# Patient Record
Sex: Female | Born: 1975 | Race: White | Hispanic: No | Marital: Single | Smoking: Former smoker
Health system: Southern US, Community
[De-identification: ages and names within clinical notes are randomized; demographics above are authoritative.]

## PROBLEM LIST (undated history)

## (undated) DIAGNOSIS — K900 Celiac disease: Secondary | ICD-10-CM

## (undated) DIAGNOSIS — J189 Pneumonia, unspecified organism: Secondary | ICD-10-CM

## (undated) DIAGNOSIS — R102 Pelvic and perineal pain unspecified side: Secondary | ICD-10-CM

## (undated) DIAGNOSIS — J449 Chronic obstructive pulmonary disease, unspecified: Secondary | ICD-10-CM

## (undated) HISTORY — DX: Pelvic and perineal pain: R10.2

## (undated) HISTORY — DX: Pelvic and perineal pain unspecified side: R10.20

## (undated) HISTORY — PX: ABDOMINAL HYSTERECTOMY: SHX81

## (undated) HISTORY — DX: Chronic obstructive pulmonary disease, unspecified: J44.9

## (undated) HISTORY — PX: FRACTURE SURGERY: SHX138

## (undated) HISTORY — PX: ABDOMINAL SURGERY: SHX537

---

## 1997-04-24 ENCOUNTER — Inpatient Hospital Stay (HOSPITAL_COMMUNITY): Admission: AD | Admit: 1997-04-24 | Discharge: 1997-04-24 | Payer: Self-pay | Admitting: Obstetrics

## 1997-04-27 ENCOUNTER — Inpatient Hospital Stay (HOSPITAL_COMMUNITY): Admission: AD | Admit: 1997-04-27 | Discharge: 1997-04-27 | Payer: Self-pay | Admitting: Obstetrics & Gynecology

## 1997-05-02 ENCOUNTER — Inpatient Hospital Stay (HOSPITAL_COMMUNITY): Admission: AD | Admit: 1997-05-02 | Discharge: 1997-05-02 | Payer: Self-pay | Admitting: Obstetrics & Gynecology

## 1997-06-18 ENCOUNTER — Inpatient Hospital Stay (HOSPITAL_COMMUNITY): Admission: AD | Admit: 1997-06-18 | Discharge: 1997-06-18 | Payer: Self-pay | Admitting: Obstetrics & Gynecology

## 1997-08-27 ENCOUNTER — Inpatient Hospital Stay (HOSPITAL_COMMUNITY): Admission: AD | Admit: 1997-08-27 | Discharge: 1997-08-27 | Payer: Self-pay | Admitting: *Deleted

## 1997-08-28 ENCOUNTER — Inpatient Hospital Stay (HOSPITAL_COMMUNITY): Admission: RE | Admit: 1997-08-28 | Discharge: 1997-08-28 | Payer: Self-pay | Admitting: *Deleted

## 1997-08-30 ENCOUNTER — Inpatient Hospital Stay (HOSPITAL_COMMUNITY): Admission: AD | Admit: 1997-08-30 | Discharge: 1997-08-30 | Payer: Self-pay | Admitting: *Deleted

## 1997-08-31 ENCOUNTER — Inpatient Hospital Stay (HOSPITAL_COMMUNITY): Admission: AD | Admit: 1997-08-31 | Discharge: 1997-08-31 | Payer: Self-pay | Admitting: *Deleted

## 1997-09-01 ENCOUNTER — Inpatient Hospital Stay (HOSPITAL_COMMUNITY): Admission: AD | Admit: 1997-09-01 | Discharge: 1997-09-01 | Payer: Self-pay | Admitting: Obstetrics

## 1997-09-14 ENCOUNTER — Inpatient Hospital Stay (HOSPITAL_COMMUNITY): Admission: AD | Admit: 1997-09-14 | Discharge: 1997-09-14 | Payer: Self-pay | Admitting: Obstetrics

## 1997-09-22 ENCOUNTER — Inpatient Hospital Stay (HOSPITAL_COMMUNITY): Admission: AD | Admit: 1997-09-22 | Discharge: 1997-09-22 | Payer: Self-pay | Admitting: Obstetrics

## 1997-09-24 ENCOUNTER — Inpatient Hospital Stay (HOSPITAL_COMMUNITY): Admission: AD | Admit: 1997-09-24 | Discharge: 1997-09-24 | Payer: Self-pay | Admitting: *Deleted

## 1997-10-18 ENCOUNTER — Inpatient Hospital Stay (HOSPITAL_COMMUNITY): Admission: AD | Admit: 1997-10-18 | Discharge: 1997-10-18 | Payer: Self-pay | Admitting: Obstetrics

## 1997-10-21 ENCOUNTER — Inpatient Hospital Stay (HOSPITAL_COMMUNITY): Admission: AD | Admit: 1997-10-21 | Discharge: 1997-10-21 | Payer: Self-pay | Admitting: Obstetrics & Gynecology

## 1997-10-30 ENCOUNTER — Inpatient Hospital Stay (HOSPITAL_COMMUNITY): Admission: AD | Admit: 1997-10-30 | Discharge: 1997-10-30 | Payer: Self-pay | Admitting: Obstetrics

## 1997-11-13 ENCOUNTER — Inpatient Hospital Stay (HOSPITAL_COMMUNITY): Admission: AD | Admit: 1997-11-13 | Discharge: 1997-11-13 | Payer: Self-pay | Admitting: Obstetrics

## 1997-11-16 ENCOUNTER — Inpatient Hospital Stay (HOSPITAL_COMMUNITY): Admission: AD | Admit: 1997-11-16 | Discharge: 1997-11-18 | Payer: Self-pay | Admitting: Obstetrics

## 1997-11-23 ENCOUNTER — Inpatient Hospital Stay (HOSPITAL_COMMUNITY): Admission: AD | Admit: 1997-11-23 | Discharge: 1997-11-23 | Payer: Self-pay | Admitting: Obstetrics

## 1997-11-29 ENCOUNTER — Inpatient Hospital Stay (HOSPITAL_COMMUNITY): Admission: AD | Admit: 1997-11-29 | Discharge: 1997-11-29 | Payer: Self-pay | Admitting: Obstetrics

## 1997-12-02 ENCOUNTER — Inpatient Hospital Stay (HOSPITAL_COMMUNITY): Admission: AD | Admit: 1997-12-02 | Discharge: 1997-12-02 | Payer: Self-pay | Admitting: Obstetrics

## 1997-12-10 ENCOUNTER — Inpatient Hospital Stay (HOSPITAL_COMMUNITY): Admission: AD | Admit: 1997-12-10 | Discharge: 1997-12-13 | Payer: Self-pay | Admitting: Obstetrics

## 1997-12-13 ENCOUNTER — Encounter (HOSPITAL_COMMUNITY): Admission: RE | Admit: 1997-12-13 | Discharge: 1998-02-07 | Payer: Self-pay | Admitting: Obstetrics

## 1998-03-28 ENCOUNTER — Inpatient Hospital Stay (HOSPITAL_COMMUNITY): Admission: AD | Admit: 1998-03-28 | Discharge: 1998-03-28 | Payer: Self-pay | Admitting: Obstetrics

## 1998-06-18 ENCOUNTER — Inpatient Hospital Stay (HOSPITAL_COMMUNITY): Admission: AD | Admit: 1998-06-18 | Discharge: 1998-06-18 | Payer: Self-pay | Admitting: Obstetrics

## 1998-07-19 ENCOUNTER — Emergency Department (HOSPITAL_COMMUNITY): Admission: EM | Admit: 1998-07-19 | Discharge: 1998-07-19 | Payer: Self-pay | Admitting: Emergency Medicine

## 1998-08-25 ENCOUNTER — Emergency Department (HOSPITAL_COMMUNITY): Admission: EM | Admit: 1998-08-25 | Discharge: 1998-08-26 | Payer: Self-pay | Admitting: Emergency Medicine

## 1998-08-25 ENCOUNTER — Encounter: Payer: Self-pay | Admitting: Emergency Medicine

## 1998-08-26 ENCOUNTER — Emergency Department (HOSPITAL_COMMUNITY): Admission: EM | Admit: 1998-08-26 | Discharge: 1998-08-27 | Payer: Self-pay

## 1998-09-30 ENCOUNTER — Inpatient Hospital Stay (HOSPITAL_COMMUNITY): Admission: AD | Admit: 1998-09-30 | Discharge: 1998-09-30 | Payer: Self-pay | Admitting: *Deleted

## 1998-11-16 ENCOUNTER — Emergency Department (HOSPITAL_COMMUNITY): Admission: EM | Admit: 1998-11-16 | Discharge: 1998-11-16 | Payer: Self-pay

## 1998-11-30 ENCOUNTER — Encounter: Payer: Self-pay | Admitting: Obstetrics

## 1998-11-30 ENCOUNTER — Inpatient Hospital Stay (HOSPITAL_COMMUNITY): Admission: AD | Admit: 1998-11-30 | Discharge: 1998-12-02 | Payer: Self-pay | Admitting: Obstetrics

## 1998-12-01 ENCOUNTER — Encounter: Payer: Self-pay | Admitting: Obstetrics

## 1999-01-14 ENCOUNTER — Inpatient Hospital Stay (HOSPITAL_COMMUNITY): Admission: AD | Admit: 1999-01-14 | Discharge: 1999-01-14 | Payer: Self-pay | Admitting: Obstetrics

## 1999-02-14 ENCOUNTER — Emergency Department (HOSPITAL_COMMUNITY): Admission: EM | Admit: 1999-02-14 | Discharge: 1999-02-14 | Payer: Self-pay | Admitting: *Deleted

## 1999-03-09 ENCOUNTER — Encounter: Payer: Self-pay | Admitting: Obstetrics

## 1999-03-09 ENCOUNTER — Inpatient Hospital Stay (HOSPITAL_COMMUNITY): Admission: AD | Admit: 1999-03-09 | Discharge: 1999-03-10 | Payer: Self-pay | Admitting: Obstetrics & Gynecology

## 1999-06-13 ENCOUNTER — Emergency Department (HOSPITAL_COMMUNITY): Admission: EM | Admit: 1999-06-13 | Discharge: 1999-06-13 | Payer: Self-pay | Admitting: *Deleted

## 1999-06-19 ENCOUNTER — Encounter: Payer: Self-pay | Admitting: Obstetrics

## 1999-06-19 ENCOUNTER — Inpatient Hospital Stay (HOSPITAL_COMMUNITY): Admission: AD | Admit: 1999-06-19 | Discharge: 1999-06-19 | Payer: Self-pay | Admitting: Obstetrics

## 1999-07-20 ENCOUNTER — Inpatient Hospital Stay (HOSPITAL_COMMUNITY): Admission: AD | Admit: 1999-07-20 | Discharge: 1999-07-20 | Payer: Self-pay | Admitting: Obstetrics

## 1999-08-11 ENCOUNTER — Encounter: Payer: Self-pay | Admitting: Emergency Medicine

## 1999-08-11 ENCOUNTER — Emergency Department (HOSPITAL_COMMUNITY): Admission: EM | Admit: 1999-08-11 | Discharge: 1999-08-12 | Payer: Self-pay | Admitting: Emergency Medicine

## 1999-08-18 ENCOUNTER — Encounter: Payer: Self-pay | Admitting: Emergency Medicine

## 1999-08-18 ENCOUNTER — Emergency Department (HOSPITAL_COMMUNITY): Admission: EM | Admit: 1999-08-18 | Discharge: 1999-08-18 | Payer: Self-pay | Admitting: Emergency Medicine

## 1999-09-25 ENCOUNTER — Inpatient Hospital Stay (HOSPITAL_COMMUNITY): Admission: AD | Admit: 1999-09-25 | Discharge: 1999-09-25 | Payer: Self-pay | Admitting: Obstetrics

## 1999-09-30 ENCOUNTER — Inpatient Hospital Stay (HOSPITAL_COMMUNITY): Admission: AD | Admit: 1999-09-30 | Discharge: 1999-09-30 | Payer: Self-pay | Admitting: Obstetrics

## 1999-10-01 ENCOUNTER — Ambulatory Visit (HOSPITAL_COMMUNITY): Admission: EM | Admit: 1999-10-01 | Discharge: 1999-10-01 | Payer: Self-pay | Admitting: Obstetrics & Gynecology

## 1999-10-01 ENCOUNTER — Encounter: Payer: Self-pay | Admitting: Obstetrics & Gynecology

## 1999-11-06 ENCOUNTER — Inpatient Hospital Stay (HOSPITAL_COMMUNITY): Admission: AD | Admit: 1999-11-06 | Discharge: 1999-11-06 | Payer: Self-pay | Admitting: Obstetrics & Gynecology

## 1999-12-09 ENCOUNTER — Emergency Department (HOSPITAL_COMMUNITY): Admission: EM | Admit: 1999-12-09 | Discharge: 1999-12-09 | Payer: Self-pay | Admitting: Emergency Medicine

## 1999-12-09 ENCOUNTER — Encounter: Payer: Self-pay | Admitting: Emergency Medicine

## 1999-12-10 ENCOUNTER — Inpatient Hospital Stay (HOSPITAL_COMMUNITY): Admission: AD | Admit: 1999-12-10 | Discharge: 1999-12-10 | Payer: Self-pay | Admitting: Obstetrics

## 1999-12-10 ENCOUNTER — Encounter: Payer: Self-pay | Admitting: Emergency Medicine

## 1999-12-31 ENCOUNTER — Inpatient Hospital Stay (HOSPITAL_COMMUNITY): Admission: AD | Admit: 1999-12-31 | Discharge: 1999-12-31 | Payer: Self-pay | Admitting: Obstetrics & Gynecology

## 2000-01-28 ENCOUNTER — Other Ambulatory Visit: Admission: RE | Admit: 2000-01-28 | Discharge: 2000-01-28 | Payer: Self-pay | Admitting: Obstetrics

## 2000-01-28 ENCOUNTER — Encounter: Admission: RE | Admit: 2000-01-28 | Discharge: 2000-01-28 | Payer: Self-pay | Admitting: Obstetrics

## 2000-02-01 ENCOUNTER — Encounter: Admission: RE | Admit: 2000-02-01 | Discharge: 2000-02-01 | Payer: Self-pay | Admitting: Internal Medicine

## 2000-02-02 ENCOUNTER — Encounter: Payer: Self-pay | Admitting: Emergency Medicine

## 2000-02-02 ENCOUNTER — Emergency Department (HOSPITAL_COMMUNITY): Admission: EM | Admit: 2000-02-02 | Discharge: 2000-02-02 | Payer: Self-pay | Admitting: Emergency Medicine

## 2000-02-03 ENCOUNTER — Inpatient Hospital Stay (HOSPITAL_COMMUNITY): Admission: EM | Admit: 2000-02-03 | Discharge: 2000-02-04 | Payer: Self-pay | Admitting: Emergency Medicine

## 2000-02-03 ENCOUNTER — Encounter: Payer: Self-pay | Admitting: Emergency Medicine

## 2000-02-08 ENCOUNTER — Encounter: Admission: RE | Admit: 2000-02-08 | Discharge: 2000-02-08 | Payer: Self-pay | Admitting: Internal Medicine

## 2000-02-25 ENCOUNTER — Emergency Department (HOSPITAL_COMMUNITY): Admission: EM | Admit: 2000-02-25 | Discharge: 2000-02-25 | Payer: Self-pay | Admitting: Emergency Medicine

## 2000-02-25 ENCOUNTER — Encounter: Payer: Self-pay | Admitting: Emergency Medicine

## 2000-03-11 ENCOUNTER — Encounter: Admission: RE | Admit: 2000-03-11 | Discharge: 2000-03-11 | Payer: Self-pay | Admitting: Internal Medicine

## 2000-03-15 ENCOUNTER — Encounter: Payer: Self-pay | Admitting: Internal Medicine

## 2000-03-15 ENCOUNTER — Inpatient Hospital Stay (HOSPITAL_COMMUNITY): Admission: EM | Admit: 2000-03-15 | Discharge: 2000-03-16 | Payer: Self-pay | Admitting: Emergency Medicine

## 2000-03-16 ENCOUNTER — Encounter: Payer: Self-pay | Admitting: Internal Medicine

## 2000-03-22 ENCOUNTER — Inpatient Hospital Stay (HOSPITAL_COMMUNITY): Admission: AD | Admit: 2000-03-22 | Discharge: 2000-03-22 | Payer: Self-pay | Admitting: *Deleted

## 2000-03-23 ENCOUNTER — Encounter: Admission: RE | Admit: 2000-03-23 | Discharge: 2000-03-23 | Payer: Self-pay | Admitting: Internal Medicine

## 2000-04-12 ENCOUNTER — Emergency Department (HOSPITAL_COMMUNITY): Admission: EM | Admit: 2000-04-12 | Discharge: 2000-04-12 | Payer: Self-pay

## 2000-04-13 ENCOUNTER — Emergency Department (HOSPITAL_COMMUNITY): Admission: EM | Admit: 2000-04-13 | Discharge: 2000-04-13 | Payer: Self-pay | Admitting: Emergency Medicine

## 2000-04-14 ENCOUNTER — Encounter: Admission: RE | Admit: 2000-04-14 | Discharge: 2000-04-14 | Payer: Self-pay

## 2000-04-15 ENCOUNTER — Encounter: Payer: Self-pay | Admitting: Emergency Medicine

## 2000-04-16 ENCOUNTER — Encounter: Payer: Self-pay | Admitting: Internal Medicine

## 2000-04-16 ENCOUNTER — Inpatient Hospital Stay (HOSPITAL_COMMUNITY): Admission: EM | Admit: 2000-04-16 | Discharge: 2000-04-18 | Payer: Self-pay | Admitting: Emergency Medicine

## 2000-04-28 ENCOUNTER — Encounter: Admission: RE | Admit: 2000-04-28 | Discharge: 2000-04-28 | Payer: Self-pay | Admitting: Internal Medicine

## 2000-05-17 ENCOUNTER — Encounter: Admission: RE | Admit: 2000-05-17 | Discharge: 2000-05-17 | Payer: Self-pay | Admitting: Hematology and Oncology

## 2000-05-24 ENCOUNTER — Encounter: Admission: RE | Admit: 2000-05-24 | Discharge: 2000-05-24 | Payer: Self-pay | Admitting: Hematology and Oncology

## 2000-05-26 ENCOUNTER — Encounter: Admission: RE | Admit: 2000-05-26 | Discharge: 2000-05-26 | Payer: Self-pay | Admitting: Internal Medicine

## 2000-06-02 ENCOUNTER — Encounter: Admission: RE | Admit: 2000-06-02 | Discharge: 2000-06-02 | Payer: Self-pay | Admitting: Obstetrics

## 2000-07-06 ENCOUNTER — Ambulatory Visit (HOSPITAL_COMMUNITY): Admission: RE | Admit: 2000-07-06 | Discharge: 2000-07-06 | Payer: Self-pay | Admitting: Internal Medicine

## 2000-07-06 ENCOUNTER — Encounter: Admission: RE | Admit: 2000-07-06 | Discharge: 2000-07-06 | Payer: Self-pay | Admitting: Internal Medicine

## 2000-07-06 ENCOUNTER — Encounter: Payer: Self-pay | Admitting: Internal Medicine

## 2000-07-08 ENCOUNTER — Ambulatory Visit (HOSPITAL_COMMUNITY): Admission: RE | Admit: 2000-07-08 | Discharge: 2000-07-08 | Payer: Self-pay | Admitting: *Deleted

## 2000-07-13 ENCOUNTER — Ambulatory Visit (HOSPITAL_COMMUNITY): Admission: RE | Admit: 2000-07-13 | Discharge: 2000-07-13 | Payer: Self-pay | Admitting: Internal Medicine

## 2000-07-20 ENCOUNTER — Encounter: Admission: RE | Admit: 2000-07-20 | Discharge: 2000-07-20 | Payer: Self-pay | Admitting: Internal Medicine

## 2000-08-29 ENCOUNTER — Emergency Department (HOSPITAL_COMMUNITY): Admission: EM | Admit: 2000-08-29 | Discharge: 2000-08-29 | Payer: Self-pay | Admitting: Emergency Medicine

## 2000-08-30 ENCOUNTER — Emergency Department (HOSPITAL_COMMUNITY): Admission: EM | Admit: 2000-08-30 | Discharge: 2000-08-30 | Payer: Self-pay | Admitting: Emergency Medicine

## 2000-08-30 ENCOUNTER — Encounter: Payer: Self-pay | Admitting: Emergency Medicine

## 2000-08-31 ENCOUNTER — Encounter: Admission: RE | Admit: 2000-08-31 | Discharge: 2000-08-31 | Payer: Self-pay | Admitting: Internal Medicine

## 2000-09-08 ENCOUNTER — Encounter: Admission: RE | Admit: 2000-09-08 | Discharge: 2000-09-08 | Payer: Self-pay | Admitting: Internal Medicine

## 2000-09-09 ENCOUNTER — Emergency Department (HOSPITAL_COMMUNITY): Admission: EM | Admit: 2000-09-09 | Discharge: 2000-09-10 | Payer: Self-pay | Admitting: Emergency Medicine

## 2000-09-16 ENCOUNTER — Ambulatory Visit (HOSPITAL_COMMUNITY): Admission: RE | Admit: 2000-09-16 | Discharge: 2000-09-16 | Payer: Self-pay | Admitting: Gastroenterology

## 2000-09-16 ENCOUNTER — Encounter: Payer: Self-pay | Admitting: Gastroenterology

## 2000-09-20 ENCOUNTER — Encounter: Admission: RE | Admit: 2000-09-20 | Discharge: 2000-09-20 | Payer: Self-pay | Admitting: Internal Medicine

## 2000-09-26 ENCOUNTER — Ambulatory Visit (HOSPITAL_COMMUNITY): Admission: RE | Admit: 2000-09-26 | Discharge: 2000-09-26 | Payer: Self-pay | Admitting: Obstetrics

## 2000-10-04 ENCOUNTER — Encounter: Admission: RE | Admit: 2000-10-04 | Discharge: 2000-10-04 | Payer: Self-pay | Admitting: Obstetrics & Gynecology

## 2000-10-06 ENCOUNTER — Emergency Department (HOSPITAL_COMMUNITY): Admission: EM | Admit: 2000-10-06 | Discharge: 2000-10-07 | Payer: Self-pay | Admitting: Emergency Medicine

## 2000-10-06 ENCOUNTER — Encounter: Payer: Self-pay | Admitting: Emergency Medicine

## 2000-10-07 ENCOUNTER — Encounter: Payer: Self-pay | Admitting: Emergency Medicine

## 2000-11-15 ENCOUNTER — Encounter: Admission: RE | Admit: 2000-11-15 | Discharge: 2000-11-15 | Payer: Self-pay | Admitting: Internal Medicine

## 2000-11-17 ENCOUNTER — Emergency Department (HOSPITAL_COMMUNITY): Admission: EM | Admit: 2000-11-17 | Discharge: 2000-11-17 | Payer: Self-pay | Admitting: Emergency Medicine

## 2000-11-22 ENCOUNTER — Ambulatory Visit (HOSPITAL_COMMUNITY): Admission: RE | Admit: 2000-11-22 | Discharge: 2000-11-22 | Payer: Self-pay | Admitting: Internal Medicine

## 2000-11-22 ENCOUNTER — Encounter: Admission: RE | Admit: 2000-11-22 | Discharge: 2000-11-22 | Payer: Self-pay | Admitting: Internal Medicine

## 2000-12-08 ENCOUNTER — Inpatient Hospital Stay (HOSPITAL_COMMUNITY): Admission: AD | Admit: 2000-12-08 | Discharge: 2000-12-08 | Payer: Self-pay | Admitting: Obstetrics & Gynecology

## 2000-12-19 ENCOUNTER — Encounter: Payer: Self-pay | Admitting: Gastroenterology

## 2000-12-19 ENCOUNTER — Ambulatory Visit (HOSPITAL_COMMUNITY): Admission: RE | Admit: 2000-12-19 | Discharge: 2000-12-19 | Payer: Self-pay | Admitting: Gastroenterology

## 2001-01-11 ENCOUNTER — Encounter: Payer: Self-pay | Admitting: Emergency Medicine

## 2001-01-11 ENCOUNTER — Emergency Department (HOSPITAL_COMMUNITY): Admission: EM | Admit: 2001-01-11 | Discharge: 2001-01-12 | Payer: Self-pay | Admitting: Emergency Medicine

## 2001-01-12 ENCOUNTER — Encounter: Payer: Self-pay | Admitting: Emergency Medicine

## 2001-02-15 ENCOUNTER — Other Ambulatory Visit: Admission: RE | Admit: 2001-02-15 | Discharge: 2001-02-15 | Payer: Self-pay | Admitting: Obstetrics and Gynecology

## 2001-02-26 ENCOUNTER — Encounter: Payer: Self-pay | Admitting: Emergency Medicine

## 2001-02-26 ENCOUNTER — Emergency Department (HOSPITAL_COMMUNITY): Admission: EM | Admit: 2001-02-26 | Discharge: 2001-02-26 | Payer: Self-pay | Admitting: Emergency Medicine

## 2001-05-15 ENCOUNTER — Encounter: Payer: Self-pay | Admitting: Emergency Medicine

## 2001-05-15 ENCOUNTER — Emergency Department (HOSPITAL_COMMUNITY): Admission: EM | Admit: 2001-05-15 | Discharge: 2001-05-15 | Payer: Self-pay | Admitting: Emergency Medicine

## 2001-07-17 ENCOUNTER — Encounter: Payer: Self-pay | Admitting: Emergency Medicine

## 2001-07-17 ENCOUNTER — Emergency Department (HOSPITAL_COMMUNITY): Admission: EM | Admit: 2001-07-17 | Discharge: 2001-07-17 | Payer: Self-pay | Admitting: Emergency Medicine

## 2001-07-20 ENCOUNTER — Emergency Department (HOSPITAL_COMMUNITY): Admission: EM | Admit: 2001-07-20 | Discharge: 2001-07-20 | Payer: Self-pay | Admitting: Emergency Medicine

## 2001-07-21 ENCOUNTER — Emergency Department (HOSPITAL_COMMUNITY): Admission: EM | Admit: 2001-07-21 | Discharge: 2001-07-22 | Payer: Self-pay | Admitting: *Deleted

## 2001-07-21 ENCOUNTER — Encounter: Payer: Self-pay | Admitting: Emergency Medicine

## 2001-09-18 ENCOUNTER — Inpatient Hospital Stay (HOSPITAL_COMMUNITY): Admission: AD | Admit: 2001-09-18 | Discharge: 2001-09-18 | Payer: Self-pay | Admitting: *Deleted

## 2001-09-19 ENCOUNTER — Inpatient Hospital Stay (HOSPITAL_COMMUNITY): Admission: AD | Admit: 2001-09-19 | Discharge: 2001-09-23 | Payer: Self-pay | Admitting: *Deleted

## 2001-09-20 ENCOUNTER — Encounter: Payer: Self-pay | Admitting: *Deleted

## 2001-10-09 ENCOUNTER — Inpatient Hospital Stay (HOSPITAL_COMMUNITY): Admission: AD | Admit: 2001-10-09 | Discharge: 2001-10-09 | Payer: Self-pay | Admitting: Family Medicine

## 2001-12-20 ENCOUNTER — Inpatient Hospital Stay (HOSPITAL_COMMUNITY): Admission: AD | Admit: 2001-12-20 | Discharge: 2001-12-20 | Payer: Self-pay | Admitting: Obstetrics and Gynecology

## 2002-01-10 ENCOUNTER — Other Ambulatory Visit: Admission: RE | Admit: 2002-01-10 | Discharge: 2002-01-10 | Payer: Self-pay | Admitting: Internal Medicine

## 2002-01-10 ENCOUNTER — Encounter: Admission: RE | Admit: 2002-01-10 | Discharge: 2002-01-10 | Payer: Self-pay | Admitting: Internal Medicine

## 2002-06-16 ENCOUNTER — Encounter: Payer: Self-pay | Admitting: *Deleted

## 2002-06-16 ENCOUNTER — Inpatient Hospital Stay (HOSPITAL_COMMUNITY): Admission: AD | Admit: 2002-06-16 | Discharge: 2002-06-16 | Payer: Self-pay | Admitting: *Deleted

## 2002-08-03 ENCOUNTER — Encounter: Payer: Self-pay | Admitting: Emergency Medicine

## 2002-08-03 ENCOUNTER — Emergency Department (HOSPITAL_COMMUNITY): Admission: AC | Admit: 2002-08-03 | Discharge: 2002-08-03 | Payer: Self-pay

## 2002-12-26 ENCOUNTER — Inpatient Hospital Stay (HOSPITAL_COMMUNITY): Admission: AD | Admit: 2002-12-26 | Discharge: 2002-12-26 | Payer: Self-pay | Admitting: Obstetrics and Gynecology

## 2003-01-16 ENCOUNTER — Emergency Department (HOSPITAL_COMMUNITY): Admission: EM | Admit: 2003-01-16 | Discharge: 2003-01-17 | Payer: Self-pay | Admitting: *Deleted

## 2003-05-28 ENCOUNTER — Emergency Department (HOSPITAL_COMMUNITY): Admission: EM | Admit: 2003-05-28 | Discharge: 2003-05-29 | Payer: Self-pay

## 2003-11-16 ENCOUNTER — Emergency Department (HOSPITAL_COMMUNITY): Admission: EM | Admit: 2003-11-16 | Discharge: 2003-11-16 | Payer: Self-pay | Admitting: Emergency Medicine

## 2003-12-29 ENCOUNTER — Emergency Department (HOSPITAL_COMMUNITY): Admission: EM | Admit: 2003-12-29 | Discharge: 2003-12-30 | Payer: Self-pay | Admitting: Emergency Medicine

## 2003-12-31 ENCOUNTER — Emergency Department (HOSPITAL_COMMUNITY): Admission: EM | Admit: 2003-12-31 | Discharge: 2003-12-31 | Payer: Self-pay | Admitting: Emergency Medicine

## 2004-01-02 ENCOUNTER — Emergency Department (HOSPITAL_COMMUNITY): Admission: EM | Admit: 2004-01-02 | Discharge: 2004-01-02 | Payer: Self-pay | Admitting: Emergency Medicine

## 2004-02-12 ENCOUNTER — Ambulatory Visit: Payer: Self-pay | Admitting: Internal Medicine

## 2004-02-17 ENCOUNTER — Encounter: Admission: RE | Admit: 2004-02-17 | Discharge: 2004-03-24 | Payer: Self-pay | Admitting: Internal Medicine

## 2004-02-17 ENCOUNTER — Ambulatory Visit: Payer: Self-pay | Admitting: Internal Medicine

## 2004-03-25 ENCOUNTER — Ambulatory Visit: Payer: Self-pay | Admitting: Internal Medicine

## 2004-04-10 ENCOUNTER — Ambulatory Visit: Payer: Self-pay | Admitting: Internal Medicine

## 2004-04-14 ENCOUNTER — Ambulatory Visit (HOSPITAL_COMMUNITY): Admission: RE | Admit: 2004-04-14 | Discharge: 2004-04-14 | Payer: Self-pay | Admitting: Internal Medicine

## 2004-05-07 ENCOUNTER — Emergency Department (HOSPITAL_COMMUNITY): Admission: EM | Admit: 2004-05-07 | Discharge: 2004-05-07 | Payer: Self-pay | Admitting: Emergency Medicine

## 2004-06-24 ENCOUNTER — Emergency Department (HOSPITAL_COMMUNITY): Admission: EM | Admit: 2004-06-24 | Discharge: 2004-06-24 | Payer: Self-pay | Admitting: Emergency Medicine

## 2004-08-18 ENCOUNTER — Ambulatory Visit: Payer: Self-pay | Admitting: Internal Medicine

## 2004-10-12 ENCOUNTER — Ambulatory Visit: Payer: Self-pay | Admitting: Internal Medicine

## 2004-11-26 ENCOUNTER — Encounter (INDEPENDENT_AMBULATORY_CARE_PROVIDER_SITE_OTHER): Payer: Self-pay | Admitting: Specialist

## 2004-11-26 ENCOUNTER — Ambulatory Visit (HOSPITAL_COMMUNITY): Admission: RE | Admit: 2004-11-26 | Discharge: 2004-11-26 | Payer: Self-pay | Admitting: Gastroenterology

## 2005-01-24 ENCOUNTER — Emergency Department (HOSPITAL_COMMUNITY): Admission: EM | Admit: 2005-01-24 | Discharge: 2005-01-24 | Payer: Self-pay | Admitting: Emergency Medicine

## 2005-01-30 ENCOUNTER — Emergency Department (HOSPITAL_COMMUNITY): Admission: EM | Admit: 2005-01-30 | Discharge: 2005-01-30 | Payer: Self-pay | Admitting: Emergency Medicine

## 2005-02-10 ENCOUNTER — Emergency Department (HOSPITAL_COMMUNITY): Admission: EM | Admit: 2005-02-10 | Discharge: 2005-02-10 | Payer: Self-pay | Admitting: Emergency Medicine

## 2005-02-11 ENCOUNTER — Emergency Department (HOSPITAL_COMMUNITY): Admission: EM | Admit: 2005-02-11 | Discharge: 2005-02-11 | Payer: Self-pay | Admitting: Emergency Medicine

## 2005-03-15 ENCOUNTER — Emergency Department (HOSPITAL_COMMUNITY): Admission: EM | Admit: 2005-03-15 | Discharge: 2005-03-15 | Payer: Self-pay | Admitting: Emergency Medicine

## 2005-04-09 ENCOUNTER — Encounter (INDEPENDENT_AMBULATORY_CARE_PROVIDER_SITE_OTHER): Payer: Self-pay | Admitting: *Deleted

## 2005-04-09 ENCOUNTER — Ambulatory Visit: Payer: Self-pay | Admitting: Internal Medicine

## 2005-04-09 LAB — CONVERTED CEMR LAB
ALT: 18 units/L
AST: 21 units/L
Alkaline Phosphatase: 61 units/L
Basophils Relative: 0 %
Creatinine, Ser: 0.7 mg/dL
Eosinophils Relative: 1 %
HCT: 41 %
Lymphs Abs: 2.6 10*3/uL
MCV: 92.1 fL
Monocytes Absolute: 0.6 10*3/uL
Pap Smear: NORMAL
Platelets: 236 10*3/uL
RDW: 12.9 %
Sodium: 142 meq/L
Total Bilirubin: 0.6 mg/dL

## 2005-04-16 ENCOUNTER — Ambulatory Visit (HOSPITAL_COMMUNITY): Admission: RE | Admit: 2005-04-16 | Discharge: 2005-04-16 | Payer: Self-pay | Admitting: Internal Medicine

## 2005-04-27 ENCOUNTER — Encounter: Admission: RE | Admit: 2005-04-27 | Discharge: 2005-05-17 | Payer: Self-pay | Admitting: Nephrology

## 2005-05-17 ENCOUNTER — Emergency Department (HOSPITAL_COMMUNITY): Admission: EM | Admit: 2005-05-17 | Discharge: 2005-05-17 | Payer: Self-pay | Admitting: Family Medicine

## 2005-05-18 ENCOUNTER — Emergency Department (HOSPITAL_COMMUNITY): Admission: EM | Admit: 2005-05-18 | Discharge: 2005-05-18 | Payer: Self-pay | Admitting: Emergency Medicine

## 2005-06-03 ENCOUNTER — Emergency Department (HOSPITAL_COMMUNITY): Admission: EM | Admit: 2005-06-03 | Discharge: 2005-06-03 | Payer: Self-pay | Admitting: Emergency Medicine

## 2005-06-11 ENCOUNTER — Ambulatory Visit: Payer: Self-pay | Admitting: Internal Medicine

## 2005-06-14 ENCOUNTER — Emergency Department (HOSPITAL_COMMUNITY): Admission: EM | Admit: 2005-06-14 | Discharge: 2005-06-14 | Payer: Self-pay | Admitting: Family Medicine

## 2005-06-21 ENCOUNTER — Emergency Department (HOSPITAL_COMMUNITY): Admission: EM | Admit: 2005-06-21 | Discharge: 2005-06-21 | Payer: Self-pay | Admitting: Family Medicine

## 2005-07-06 ENCOUNTER — Encounter (INDEPENDENT_AMBULATORY_CARE_PROVIDER_SITE_OTHER): Payer: Self-pay | Admitting: Specialist

## 2005-07-06 ENCOUNTER — Ambulatory Visit (HOSPITAL_COMMUNITY): Admission: RE | Admit: 2005-07-06 | Discharge: 2005-07-06 | Payer: Self-pay | Admitting: Gastroenterology

## 2005-07-19 ENCOUNTER — Emergency Department (HOSPITAL_COMMUNITY): Admission: EM | Admit: 2005-07-19 | Discharge: 2005-07-19 | Payer: Self-pay | Admitting: Family Medicine

## 2005-07-21 ENCOUNTER — Encounter: Admission: RE | Admit: 2005-07-21 | Discharge: 2005-07-21 | Payer: Self-pay | Admitting: Gastroenterology

## 2005-08-13 ENCOUNTER — Inpatient Hospital Stay (HOSPITAL_COMMUNITY): Admission: AD | Admit: 2005-08-13 | Discharge: 2005-08-13 | Payer: Self-pay | Admitting: Obstetrics & Gynecology

## 2005-08-30 ENCOUNTER — Inpatient Hospital Stay (HOSPITAL_COMMUNITY): Admission: AD | Admit: 2005-08-30 | Discharge: 2005-08-31 | Payer: Self-pay | Admitting: Obstetrics and Gynecology

## 2005-11-10 ENCOUNTER — Emergency Department (HOSPITAL_COMMUNITY): Admission: EM | Admit: 2005-11-10 | Discharge: 2005-11-10 | Payer: Self-pay | Admitting: Family Medicine

## 2005-12-06 ENCOUNTER — Ambulatory Visit (HOSPITAL_COMMUNITY): Admission: RE | Admit: 2005-12-06 | Discharge: 2005-12-06 | Payer: Self-pay | Admitting: Gastroenterology

## 2005-12-31 ENCOUNTER — Encounter (INDEPENDENT_AMBULATORY_CARE_PROVIDER_SITE_OTHER): Payer: Self-pay | Admitting: *Deleted

## 2005-12-31 DIAGNOSIS — T7411XA Adult physical abuse, confirmed, initial encounter: Secondary | ICD-10-CM | POA: Insufficient documentation

## 2005-12-31 DIAGNOSIS — F341 Dysthymic disorder: Secondary | ICD-10-CM | POA: Insufficient documentation

## 2005-12-31 DIAGNOSIS — IMO0001 Reserved for inherently not codable concepts without codable children: Secondary | ICD-10-CM | POA: Insufficient documentation

## 2005-12-31 DIAGNOSIS — J45909 Unspecified asthma, uncomplicated: Secondary | ICD-10-CM | POA: Insufficient documentation

## 2005-12-31 DIAGNOSIS — K900 Celiac disease: Secondary | ICD-10-CM | POA: Insufficient documentation

## 2005-12-31 DIAGNOSIS — R109 Unspecified abdominal pain: Secondary | ICD-10-CM | POA: Insufficient documentation

## 2006-01-05 ENCOUNTER — Emergency Department (HOSPITAL_COMMUNITY): Admission: EM | Admit: 2006-01-05 | Discharge: 2006-01-05 | Payer: Self-pay | Admitting: Family Medicine

## 2006-02-07 ENCOUNTER — Ambulatory Visit (HOSPITAL_BASED_OUTPATIENT_CLINIC_OR_DEPARTMENT_OTHER): Admission: RE | Admit: 2006-02-07 | Discharge: 2006-02-07 | Payer: Self-pay | Admitting: Otolaryngology

## 2006-03-07 DIAGNOSIS — K589 Irritable bowel syndrome without diarrhea: Secondary | ICD-10-CM | POA: Insufficient documentation

## 2006-03-24 ENCOUNTER — Inpatient Hospital Stay (HOSPITAL_COMMUNITY): Admission: EM | Admit: 2006-03-24 | Discharge: 2006-03-26 | Payer: Self-pay | Admitting: Emergency Medicine

## 2006-04-20 ENCOUNTER — Emergency Department (HOSPITAL_COMMUNITY): Admission: EM | Admit: 2006-04-20 | Discharge: 2006-04-20 | Payer: Self-pay | Admitting: Emergency Medicine

## 2006-07-08 ENCOUNTER — Emergency Department (HOSPITAL_COMMUNITY): Admission: EM | Admit: 2006-07-08 | Discharge: 2006-07-08 | Payer: Self-pay | Admitting: Emergency Medicine

## 2006-08-08 ENCOUNTER — Encounter: Admission: RE | Admit: 2006-08-08 | Discharge: 2006-08-30 | Payer: Self-pay | Admitting: Sports Medicine

## 2006-08-09 ENCOUNTER — Emergency Department (HOSPITAL_COMMUNITY): Admission: EM | Admit: 2006-08-09 | Discharge: 2006-08-09 | Payer: Self-pay | Admitting: Emergency Medicine

## 2006-08-11 ENCOUNTER — Emergency Department (HOSPITAL_COMMUNITY): Admission: EM | Admit: 2006-08-11 | Discharge: 2006-08-11 | Payer: Self-pay | Admitting: Emergency Medicine

## 2006-08-14 ENCOUNTER — Emergency Department (HOSPITAL_COMMUNITY): Admission: EM | Admit: 2006-08-14 | Discharge: 2006-08-14 | Payer: Self-pay | Admitting: Family Medicine

## 2006-08-29 ENCOUNTER — Emergency Department (HOSPITAL_COMMUNITY): Admission: EM | Admit: 2006-08-29 | Discharge: 2006-08-30 | Payer: Self-pay | Admitting: Emergency Medicine

## 2006-09-29 ENCOUNTER — Emergency Department (HOSPITAL_COMMUNITY): Admission: EM | Admit: 2006-09-29 | Discharge: 2006-09-29 | Payer: Self-pay | Admitting: Family Medicine

## 2006-11-02 ENCOUNTER — Emergency Department (HOSPITAL_COMMUNITY): Admission: EM | Admit: 2006-11-02 | Discharge: 2006-11-02 | Payer: Self-pay | Admitting: Emergency Medicine

## 2006-11-08 ENCOUNTER — Emergency Department (HOSPITAL_COMMUNITY): Admission: EM | Admit: 2006-11-08 | Discharge: 2006-11-08 | Payer: Self-pay | Admitting: Emergency Medicine

## 2006-12-16 ENCOUNTER — Inpatient Hospital Stay (HOSPITAL_COMMUNITY): Admission: AD | Admit: 2006-12-16 | Discharge: 2006-12-16 | Payer: Self-pay | Admitting: Obstetrics & Gynecology

## 2007-01-17 ENCOUNTER — Emergency Department (HOSPITAL_COMMUNITY): Admission: EM | Admit: 2007-01-17 | Discharge: 2007-01-17 | Payer: Self-pay | Admitting: Family Medicine

## 2007-01-18 ENCOUNTER — Emergency Department (HOSPITAL_COMMUNITY): Admission: EM | Admit: 2007-01-18 | Discharge: 2007-01-18 | Payer: Self-pay | Admitting: Emergency Medicine

## 2007-02-26 ENCOUNTER — Emergency Department (HOSPITAL_COMMUNITY): Admission: EM | Admit: 2007-02-26 | Discharge: 2007-02-26 | Payer: Self-pay | Admitting: Emergency Medicine

## 2007-06-09 ENCOUNTER — Ambulatory Visit (HOSPITAL_COMMUNITY): Admission: RE | Admit: 2007-06-09 | Discharge: 2007-06-09 | Payer: Self-pay | Admitting: Urology

## 2007-06-09 ENCOUNTER — Encounter (INDEPENDENT_AMBULATORY_CARE_PROVIDER_SITE_OTHER): Payer: Self-pay | Admitting: Urology

## 2007-06-20 ENCOUNTER — Emergency Department (HOSPITAL_COMMUNITY): Admission: EM | Admit: 2007-06-20 | Discharge: 2007-06-20 | Payer: Self-pay | Admitting: Emergency Medicine

## 2007-10-04 ENCOUNTER — Emergency Department (HOSPITAL_COMMUNITY): Admission: EM | Admit: 2007-10-04 | Discharge: 2007-10-04 | Payer: Self-pay | Admitting: Emergency Medicine

## 2007-10-05 ENCOUNTER — Emergency Department (HOSPITAL_COMMUNITY): Admission: EM | Admit: 2007-10-05 | Discharge: 2007-10-05 | Payer: Self-pay | Admitting: Emergency Medicine

## 2007-10-08 ENCOUNTER — Emergency Department (HOSPITAL_COMMUNITY): Admission: EM | Admit: 2007-10-08 | Discharge: 2007-10-08 | Payer: Self-pay | Admitting: Emergency Medicine

## 2007-10-31 ENCOUNTER — Encounter (INDEPENDENT_AMBULATORY_CARE_PROVIDER_SITE_OTHER): Payer: Self-pay | Admitting: Obstetrics and Gynecology

## 2007-10-31 ENCOUNTER — Ambulatory Visit (HOSPITAL_COMMUNITY): Admission: RE | Admit: 2007-10-31 | Discharge: 2007-11-01 | Payer: Self-pay | Admitting: Obstetrics and Gynecology

## 2007-11-02 ENCOUNTER — Encounter: Admission: RE | Admit: 2007-11-02 | Discharge: 2007-11-02 | Payer: Self-pay | Admitting: Obstetrics and Gynecology

## 2007-11-04 ENCOUNTER — Observation Stay (HOSPITAL_COMMUNITY): Admission: AD | Admit: 2007-11-04 | Discharge: 2007-11-05 | Payer: Self-pay | Admitting: Obstetrics and Gynecology

## 2007-12-05 ENCOUNTER — Observation Stay (HOSPITAL_COMMUNITY): Admission: AD | Admit: 2007-12-05 | Discharge: 2007-12-06 | Payer: Self-pay | Admitting: Obstetrics and Gynecology

## 2007-12-25 ENCOUNTER — Emergency Department (HOSPITAL_COMMUNITY): Admission: EM | Admit: 2007-12-25 | Discharge: 2007-12-26 | Payer: Self-pay | Admitting: Emergency Medicine

## 2008-03-13 ENCOUNTER — Emergency Department (HOSPITAL_COMMUNITY): Admission: EM | Admit: 2008-03-13 | Discharge: 2008-03-13 | Payer: Self-pay | Admitting: Emergency Medicine

## 2008-08-04 ENCOUNTER — Emergency Department (HOSPITAL_COMMUNITY): Admission: EM | Admit: 2008-08-04 | Discharge: 2008-08-04 | Payer: Self-pay | Admitting: Emergency Medicine

## 2008-09-07 IMAGING — CR DG CHEST 2V
2 series · 2 of 2 positions shown · non-contrast
Comparison: none

HISTORY: Chest and left arm pain, dyspnea, cough, congestion

CHEST 2 VIEWS:
Comparison 08/09/2006
Normal heart size, mediastinal contours, and vascularity.
Lungs clear.
No effusion or pneumothorax.
Bones unremarkable.

[w chest pa]
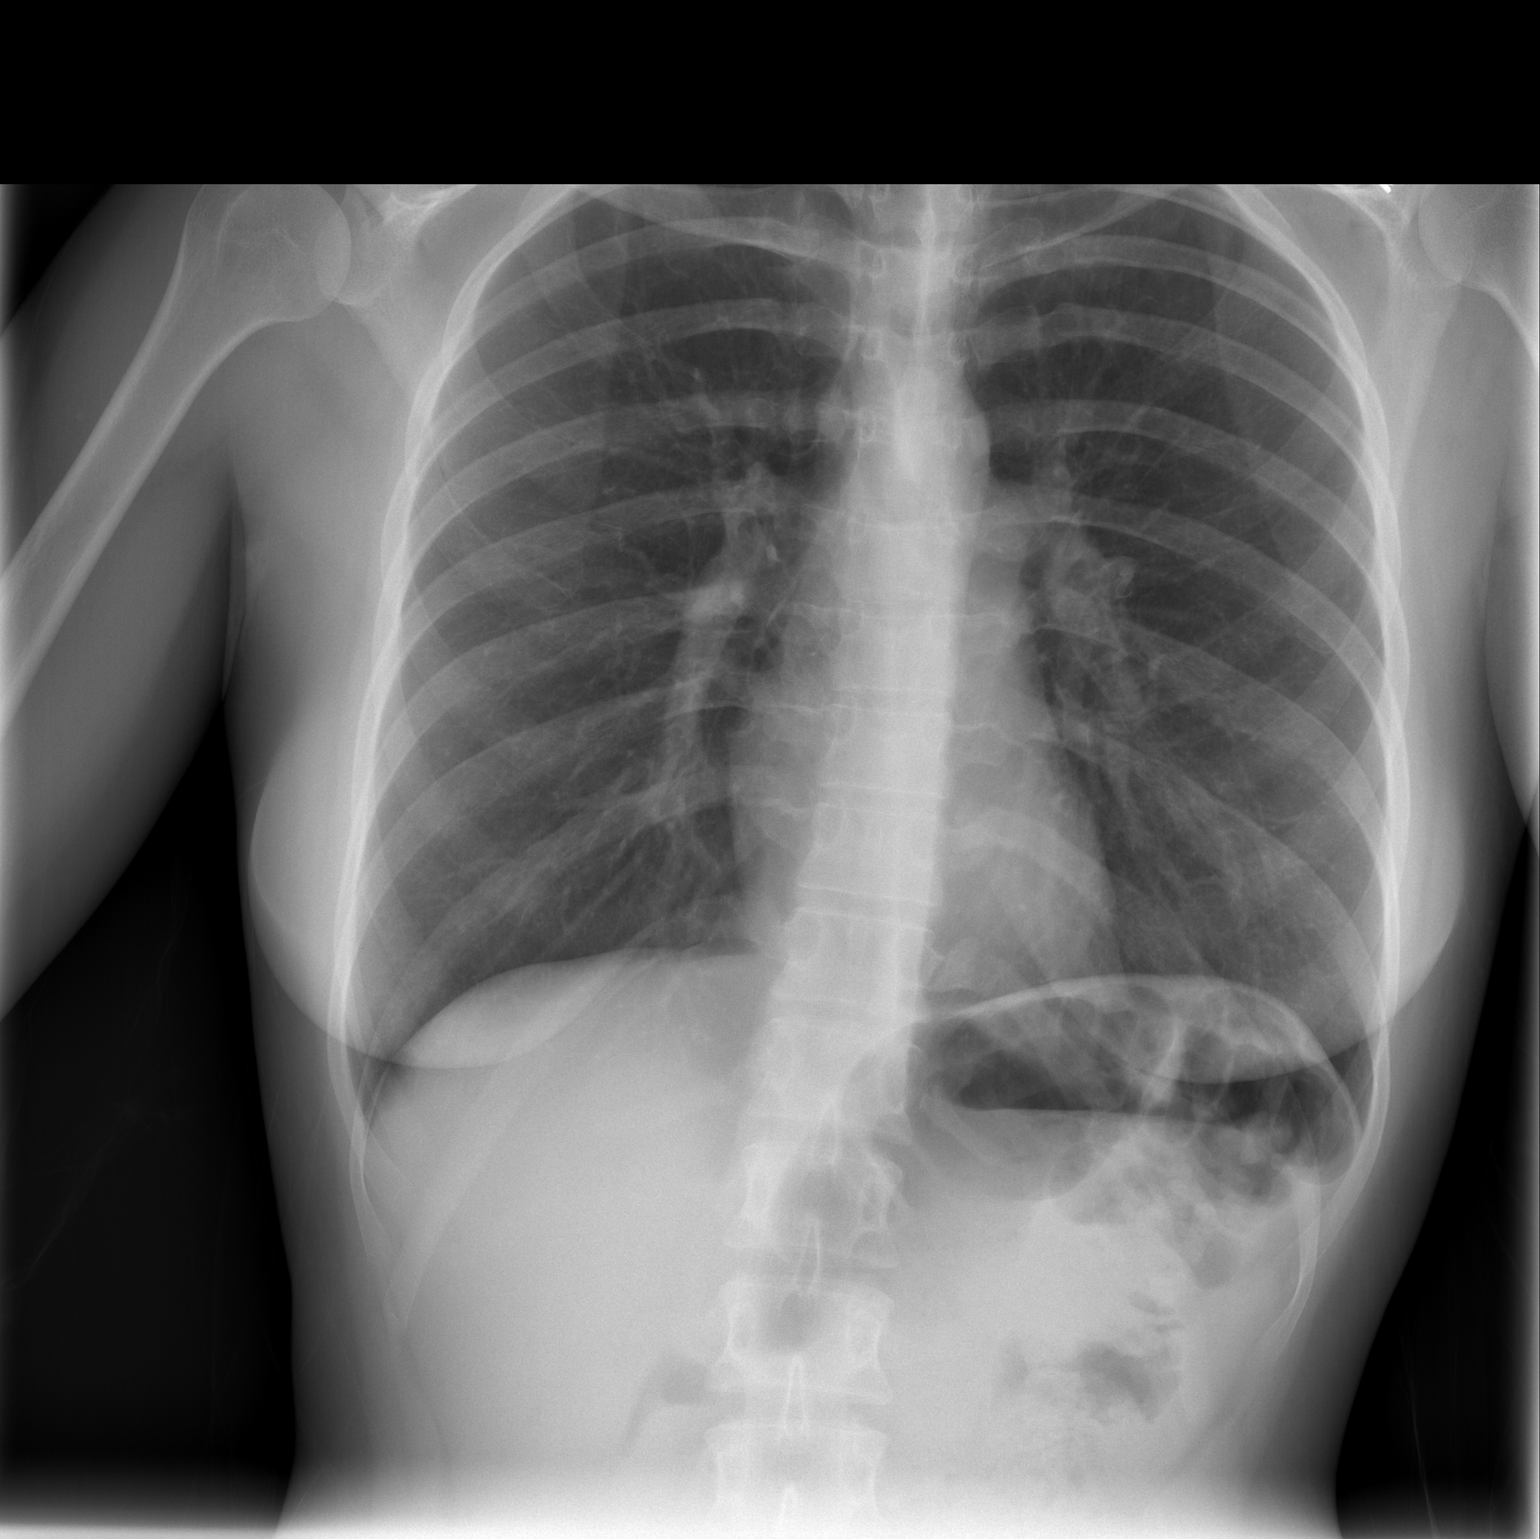

[w chest lat]
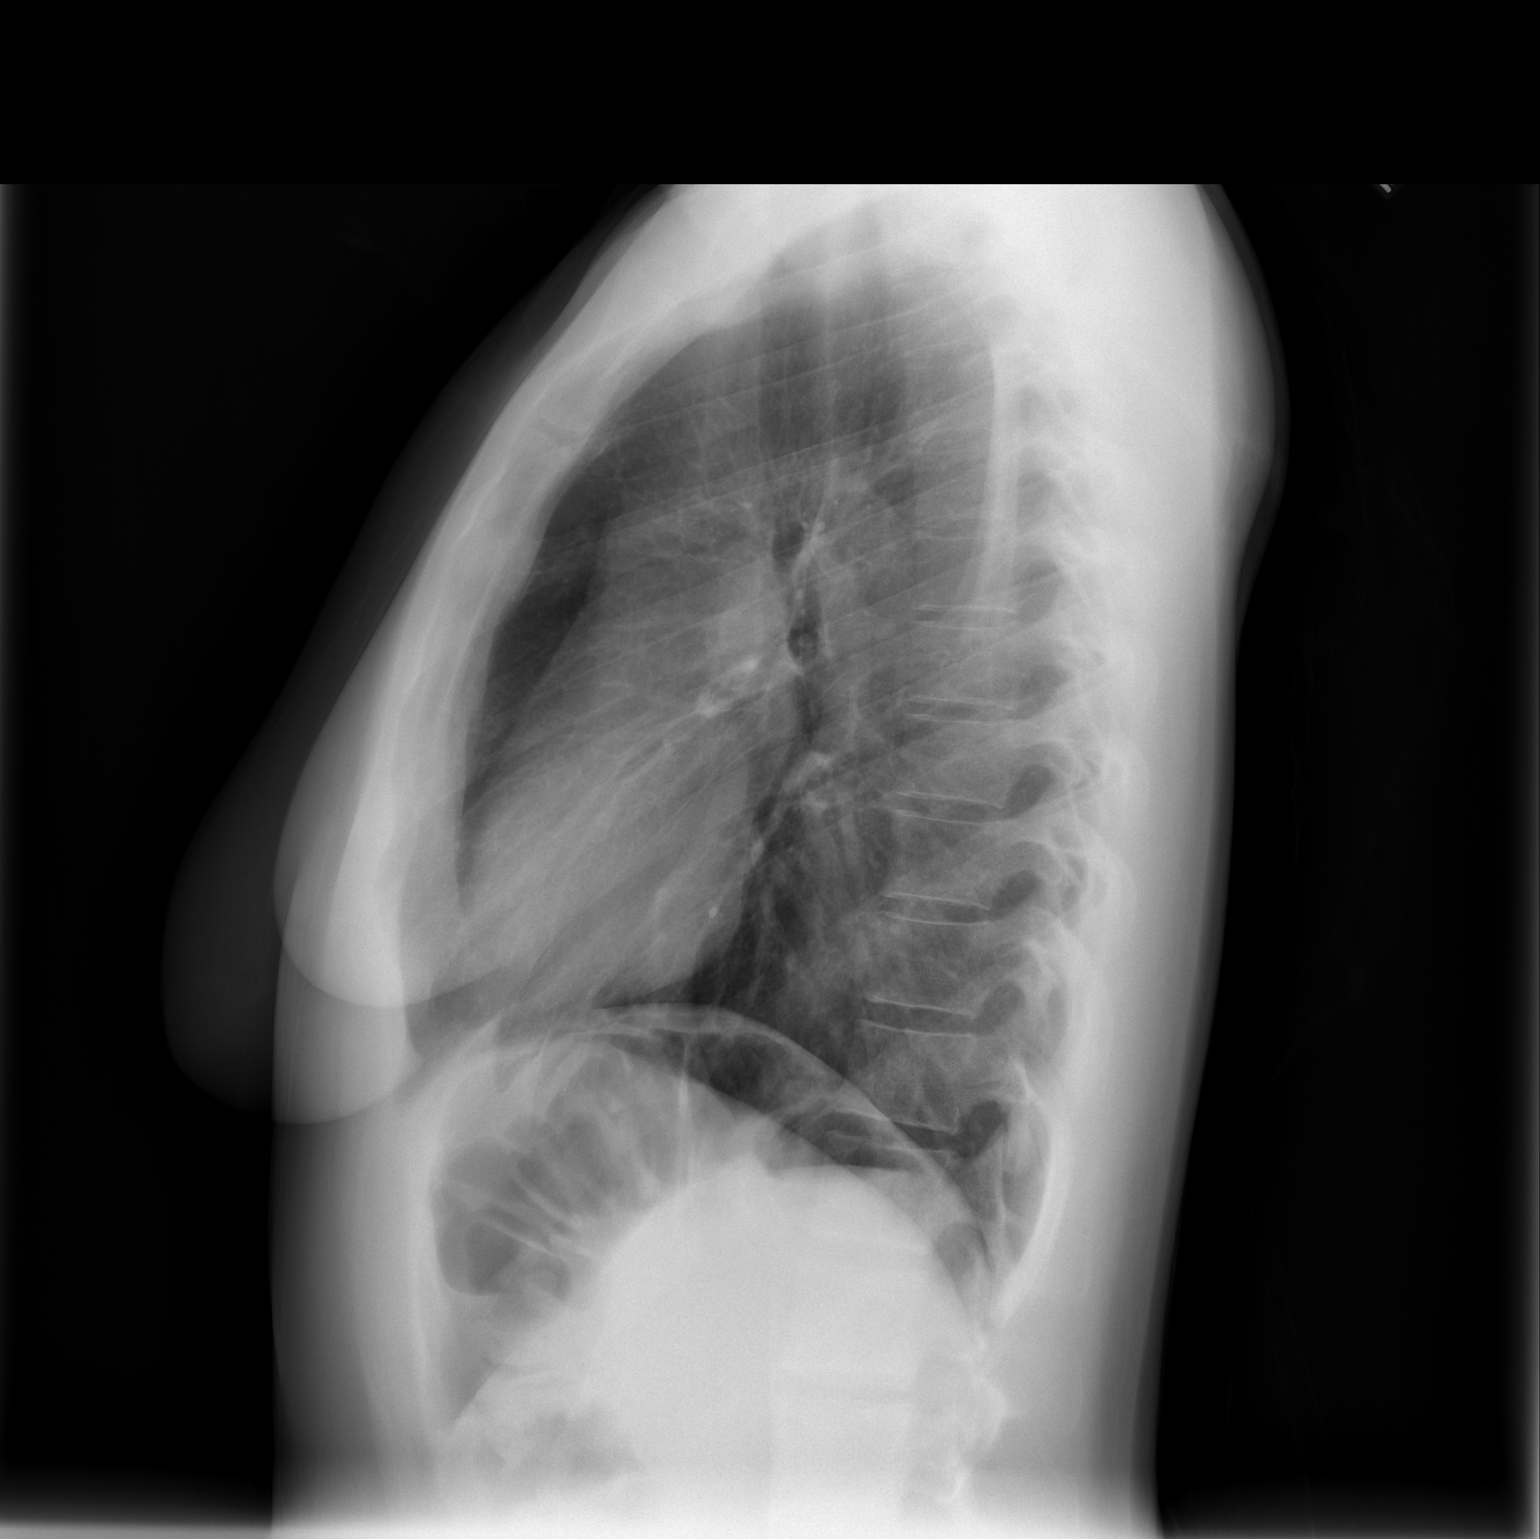

[2 of 2 positions shown; findings below may reference images not displayed]

IMPRESSION: No acute abnormalities.

## 2008-11-11 ENCOUNTER — Emergency Department (HOSPITAL_COMMUNITY): Admission: EM | Admit: 2008-11-11 | Discharge: 2008-11-11 | Payer: Self-pay | Admitting: Emergency Medicine

## 2008-12-15 ENCOUNTER — Emergency Department (HOSPITAL_COMMUNITY): Admission: EM | Admit: 2008-12-15 | Discharge: 2008-12-16 | Payer: Self-pay | Admitting: Emergency Medicine

## 2009-12-18 ENCOUNTER — Emergency Department (HOSPITAL_COMMUNITY): Admission: EM | Admit: 2009-12-18 | Discharge: 2009-12-18 | Payer: Self-pay | Admitting: Emergency Medicine

## 2010-01-05 ENCOUNTER — Emergency Department (HOSPITAL_COMMUNITY): Admission: EM | Admit: 2010-01-05 | Discharge: 2010-01-05 | Payer: Self-pay | Admitting: Emergency Medicine

## 2010-01-24 ENCOUNTER — Emergency Department (HOSPITAL_COMMUNITY): Admission: EM | Admit: 2010-01-24 | Discharge: 2010-01-24 | Payer: Self-pay | Admitting: Emergency Medicine

## 2010-05-13 LAB — DIFFERENTIAL
Basophils Absolute: 0.1 10*3/uL (ref 0.0–0.1)
Basophils Relative: 1 % (ref 0–1)
Eosinophils Absolute: 0.1 10*3/uL (ref 0.0–0.7)
Eosinophils Relative: 1 % (ref 0–5)
Lymphocytes Relative: 25 % (ref 12–46)
Monocytes Absolute: 0.7 10*3/uL (ref 0.1–1.0)

## 2010-05-13 LAB — URINALYSIS, ROUTINE W REFLEX MICROSCOPIC
Hgb urine dipstick: NEGATIVE
Ketones, ur: NEGATIVE mg/dL
Protein, ur: NEGATIVE mg/dL
Urobilinogen, UA: 0.2 mg/dL (ref 0.0–1.0)

## 2010-05-13 LAB — BASIC METABOLIC PANEL
BUN: 17 mg/dL (ref 6–23)
Chloride: 106 mEq/L (ref 96–112)
Creatinine, Ser: 0.69 mg/dL (ref 0.4–1.2)
Glucose, Bld: 98 mg/dL (ref 70–99)

## 2010-05-13 LAB — CBC
HCT: 41.9 % (ref 36.0–46.0)
MCH: 31.3 pg (ref 26.0–34.0)
MCHC: 34.5 g/dL (ref 30.0–36.0)
MCV: 90.8 fL (ref 78.0–100.0)
Platelets: 222 10*3/uL (ref 150–400)
RDW: 13.1 % (ref 11.5–15.5)
WBC: 11.6 10*3/uL — ABNORMAL HIGH (ref 4.0–10.5)

## 2010-05-13 LAB — PROTIME-INR: Prothrombin Time: 14.3 seconds (ref 11.6–15.2)

## 2010-05-13 LAB — HEMOCCULT GUIAC POC 1CARD (OFFICE): Fecal Occult Bld: NEGATIVE

## 2010-06-04 LAB — CBC
Hemoglobin: 12.5 g/dL (ref 12.0–15.0)
MCHC: 33.7 g/dL (ref 30.0–36.0)
RBC: 4.06 MIL/uL (ref 3.87–5.11)
RDW: 12.8 % (ref 11.5–15.5)

## 2010-06-04 LAB — RAPID URINE DRUG SCREEN, HOSP PERFORMED
Amphetamines: NOT DETECTED
Benzodiazepines: NOT DETECTED
Cocaine: NOT DETECTED

## 2010-06-04 LAB — BASIC METABOLIC PANEL
CO2: 25 mEq/L (ref 19–32)
Calcium: 8.6 mg/dL (ref 8.4–10.5)
Creatinine, Ser: 0.6 mg/dL (ref 0.4–1.2)
GFR calc Af Amer: >60 mL/min (ref >60)
GFR calc non Af Amer: >60 mL/min (ref >60)
Sodium: 137 mEq/L (ref 135–145)

## 2010-06-04 LAB — URINALYSIS, ROUTINE W REFLEX MICROSCOPIC
Bilirubin Urine: NEGATIVE
Glucose, UA: NEGATIVE mg/dL
Hgb urine dipstick: NEGATIVE
Ketones, ur: NEGATIVE mg/dL
Protein, ur: NEGATIVE mg/dL
pH: 7 (ref 5.0–8.0)

## 2010-06-04 LAB — DIFFERENTIAL
Basophils Absolute: 0.1 10*3/uL (ref 0.0–0.1)
Basophils Relative: 1 % (ref 0–1)
Lymphocytes Relative: 37 % (ref 12–46)
Neutro Abs: 4.6 10*3/uL (ref 1.7–7.7)
Neutrophils Relative %: 54 % (ref 43–77)

## 2010-06-15 LAB — ETHANOL: Alcohol, Ethyl (B): <5 mg/dL (ref 0–10)

## 2010-07-14 NOTE — Discharge Summary (Signed)
[name], [name]               ACCOUNT NO.:  [id number]   MEDICAL RECORD NO.:  [id number]          PATIENT TYPE:  OBV   LOCATION:  9315                          FACILITY:  [facility]   PHYSICIAN:  Dede Query. Rivard, M.D. DATE OF BIRTH:  10-03-1975   DATE OF ADMISSION:  12/05/2007  DATE OF DISCHARGE:  12/06/2007                               DISCHARGE SUMMARY   ADMITTING DIAGNOSES:  1. 61-monthstatus post robotic hysterectomy.  2. Report of bleeding after intercourse.  3. Abdominal pain.  4. Elevated white blood cell count.   DISCHARGE DIAGNOSES:  1. 1 month postoperative robotic hysterectomy.  2. Resolution of elevated white blood cell count.  3. Dehydration  4. Improvement of abdominal pain.   PROCEDURE:  Soapsuds enema.   HOSPITAL COURSE:  Ms. Krystal Gaskinsis a 35year old gravida 5, para 3-0-2-3,  who presented to Maternity Admissions Unit on the morning of December 05, 2007, complaining of onset of vaginal bleeding and pain during  intercourse this morning.  She had intercourse 2 days prior without  incident.  She had a robotic-assisted hysterectomy on October 31, 2007,  with Dr. RCletis Mediadue to dysmenorrhea and chronic pelvic pain.  She had  several visits to the office postoperatively with pain and other  complaints.  She had a hospitalization on November 04, 2007, to  November 05, 2007, for nausea, vomiting, fever, cough, and abdominal  pain.  She had a negative pelvic ultrasound and abdominal x-ray at that  time.  She had been on oral morphine at home with her last dose 2 weeks  ago and a lidocaine patch with occasional use.  She had been doing well  over the last few days, and had a postop visit last week with Dr.  RCletis Media  She reported she was released to regular activities.  On  presentation to Maternity Admissions Unit, she was complaining of pain  referred to her shoulder, leg pain bilaterally, abdomen, and chest pain.  She denied any nausea, vomiting, or diarrhea.  She did  have constipation  with no bowel movement in 3 days.  No dysuria or fever.  Pain was  constant was colicky exacerbation.   Her history has been remarkable for;  1. Chronic pelvic pain and dysmenorrhea.  2. Interstitial cystitis.  3. Fibromyalgia.  4. Celiac disease.  5. Multiple medication allergies.  6. History of seizures  7. History of left DVT.  8. Migraines.  9. Degenerative disk disease.  10.History of depression and anxiety.  11.History of IBS.  12.History of asthma.   On admission, the patient was afebrile.  Blood pressure was 147/90.  O2  sats were normal.  The patient was complaining of 10/10 pain on a scale.  She had positive bilateral tenderness to palpation in her abdomen, right  was slightly greater than left.  There were positive bowel sounds and  mild guarding.  There was no rebound.  She had no CVA tenderness.  Pelvic exam showed no active bleeding.  There was a small amount of pink  serosanguineous drainage in the vault.  No evidence of disruption of  vaginal wall healing was noted.  Deep tendon reflexes were normal with a  negative Homans.  The patient had a pelvic ultrasound showing normal  ovaries and no pelvic mass or fluid collection.  Hemoglobin was 12.4,  white blood cell count was 14.1 with a neutrophil percentage of 90, and  platelets were normal.  Basic metabolic panel was normal and also showed  specific gravity of 1.025, trace hemoglobin of 15 of ketones.  She had  received a total of 6 mg of morphine sulfate in Maternity Admissions IV.  The pain did diminish to approximately 8 on the scale.  Dr. Raphael Gibney saw  the patient in light of her elevated white blood cell count as well as  her history of constipation.  The decision was made to admit her  overnight.  A soapsuds enema was ordered until clear and if this did not  resolve her pain, an abdominal CT and chest x-ray were to be done.  After her enema, from which, she had excellent results.  She still  had  some right lower quadrant pain, though she tolerated regular diet.  No  acute findings of the abdominal exam noted.   By the morning of December 06, 2007, she had slept through the night, she  did still report 10/10 pain; however, she had to be awakened for vital  signs.  She had no active vomiting.  She had some mild nausea which was  treated with Zofran.  Vital signs were stable.  O2 sat was 96%.  Labs on  the morning were hemoglobin 11.3, white blood cell count of 8.5,  platelet count 160, and neutrophils were 55.  Dr. Cletis Media saw the patient  later that morning and her exam was within normal limits.  The patient  was found to be stable for discharge, and the patient was also ready to  be discharged home.  She was discharged home in good condition.   DISCHARGE INSTRUCTIONS:  The patient is to have no sexual activity for 3  weeks.  Just increase her exercise and increase her water intake.  She  is call with any increase in pain, despite these efforts.   DISCHARGE MEDICATIONS:  None.   DISCHARGE FOLLOWUP:  Will occur as previously planned at [facility].  There is also an appointment made with Dr. Jeanie Cooks for  chronic pain and depression and anxiety treatment.      Cathlean Marseilles Cira Servant, C.N.M.      Dede Query Rivard, M.D.  Electronically Signed    VLL/MEDQ  D:  12/06/2007  T:  12/07/2007  Job:  [id number]
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
## 2010-07-14 NOTE — H&P (Signed)
[name], [name]               ACCOUNT NO.:  [id number]   MEDICAL RECORD NO.:  [id number]          PATIENT TYPE:  AMB   LOCATION:                               FACILITY:  [facility]   PHYSICIAN:  Dede Query. Rivard, M.D. DATE OF BIRTH:  1975/11/10   DATE OF ADMISSION:  10/31/2007  DATE OF DISCHARGE:                              HISTORY & PHYSICAL   HISTORY OF PRESENT ILLNESS:  [name] is a 35 year old single white  female para 3-0-2-3 who presents for robot-assisted hysterectomy because  of chronic pelvic pain, menorrhagia and uterine fibroids.  For over 9  years the patient reports having heavy menstrual periods characterized  by a 7-day flow requiring the use of a tampon with a liner every 45  minutes to 1 hour.  Though the patient's periods are monthly, they  occasionally will cause her such pain that she has to stay in bed with  the inability to function.  Due to the patient's EXTENSIVE LIST OF  ALLERGIES, she has not found relief with over-the-counter Tylenol nor  with a heating pad and is unable to take much else.  Over the past 2  years the patient states that her symptoms seemingly have progressively  gotten worse.  She denies any urinary urgency, dysuria, changes in her  bowel habits or vaginitis symptoms, though she does admit to urinary  frequency, dyspareunia and worsening of her symptoms whenever she stands  for a prolonged period of time.  The pain is described as a pulling,  twisting and sharp pain, will abate to some degree whenever she lies  down.  A pelvic ultrasound in January of 2009 showed a uterus measuring  9.5 x 4.6 cm with a right ovary measuring 3.46 x 2.12 x 3.51 cm, and a  left ovary measuring 2.63 x 1.42 x 3.1 cm.  Additionally, an anterior  intramural fibroid measuring 1.7 x 1.5 x 2.2 cm, and an anterior lower  uterine segment fibroid measuring 1.3 x 1.0 x 1.5 cm.  A follow-up  ultrasound was performed on this patient in April 2009 which showed a  uterus  that measured 7.03 cm x 6.80 cm x 4.36 cm and a right ovary  measuring 3.4 x 3.3 x 1.9 -cm, and her left ovary measuring 3.53 x 4.42  x 3.12 cm.  Additionally, a left ovarian hemorrhagic cyst measuring 3.1  x 2.5 x 2.3 cm was also observed.  However, there was no comment made  regarding the previously seen uterine fibroids.  A discussion was held  with the patient regarding the medical and surgical management options  for both her menorrhagia and chronic pelvic pain, however, the patient  wishes to pursue definitive therapy in the form of hysterectomy.  It was  also reviewed with the patient that it is no guarantee that her  hysterectomy would totally alleviate her chronic pelvic pain.   PAST MEDICAL HISTORY:   OB HISTORY:  Gravida 5, para 3-0-2-3.  The patient experienced  postpartum depression, pregnancy-induced hypertension, anemia, and  hyperemesis during pregnancy.   GYN HISTORY:  Menarche 35 years old.  Last menstrual period September 26, 2007.  She uses sterilization as her method of contraception.  Denies  any history of sexually transmitted diseases.  She does have a history  of an abnormal Pap smear which was evaluated with colposcopy in 1994,  however her Pap smears since that time have remained normal, with the  most recent being July 2009.   MEDICAL HISTORY:  1. Irritable bowel syndrome.  2. Asthma.  3. Migraines.  4. Celiac disease.  5. Fibromyalgia.  6. Seizures.  7. Interstitial cystitis.  8. Degenerative disk disease (in lumbar region).  9. Depression.  10.Anxiety.  11.Left thigh deep vein thrombosis.   SURGICAL HISTORY:  1. 1999 bilateral tubal ligation.  2. 2000 diagnostic laparoscopy for pelvic pain.  3. 2007 rhinoplasty.   FAMILY HISTORY:  COPD, hypertension, thyroid disease.   SOCIAL HISTORY:  The patient lives with her mother and 3 sons and she is  a Agricultural engineer.  HABITS:  She does not use tobacco, alcohol or illicit  drugs.   CURRENT MEDICATIONS:  1.  Ventolin 2-4 puffs every 4 hours daily.  2. Symbicort 160/4.5 two puffs daily.  3. Prevacid 30 mg daily.  4. Levaquin 750 mg daily.   ALLERGIES:  VICODIN, PERCOCET, DOXYCYCLINE, ORAL CONTRACEPTIVES,  AMITRIPTYLINE AND LYRICA AND IBUPROFEN ALL CAUSE SHORTNESS OF BREATH AND  PRURITUS.  DARVOCET AND AMITRIPTYLINE BOTH CAUSE HEADACHES.  ASPIRIN  CAUSES A RASH.  IV CONTRAST DYE, DILAUDID CAUSE ANAPHYLAXIS.  ELMIRON  AND CODEINE CAUSE HALLUCINATIONS.  TRAZODONE CAUSES HEADACHES.   REVIEW OF SYSTEMS:  The patient denies any chest pain, shortness of  breath (except as is related to her asthma), headache, vision changes,  night sweats, recent weight loss,  and except as mentioned in history of  present illness the patient's review of systems is otherwise negative.   PHYSICAL EXAM:  Blood pressure is 100/80, pulse is 62, weight 156,  height 5 feet 5 inches tall.  Body mass index 26.8.  General exam:  NECK:  Supple without masses.  There is no thyromegaly or cervical  adenopathy.  HEART:  Regular rate and rhythm.  LUNGS:  Clear though breath sounds are very distant.  There were no  wheezes, rales or rhonchi heard on auscultation.  BACK:  No CVA tenderness, though the patient does have some lumbar  paraspinal tenderness.  ABDOMEN:  Diffusely tender with voluntary  guarding.  There is no rebound or organomegaly.  EXTREMITIES:  No  clubbing, cyanosis or edema.  PELVIC EXAM:  EG, BUS is within normal limits.  Vagina is rugose.  Cervix is nontender without lesions.  Uterus appears normal size, shape  and consistency and is tender.  However, exam is limited by patient's  discomfort.  Adnexa without any masses.  The adnexa is tender  bilaterally.  Rectovaginal exam without any masses   IMPRESSION:  1. Chronic pelvic pain.  2. Menorrhagia.  3. Uterine fibroids.   DISPOSITION:  A discussion was held with the patient regarding the  indications for her procedure along with its risks which include  but are  not limited to reaction to anesthesia, damage to adjacent organs,  bleeding, infection, the possible need for an open abdominal incision to  complete her surgery safely, and postoperative transient facial edema.  The patient further understands that this robotic procedure takes longer  than the traditional open abdominal procedure, that her expected  hospital stay should be between 1-2 days, and that she is expected to  return to her  usual daily activities completely within 2-3 weeks.  The  patient was given a copy of the Mira[facility]ax bowel prep to be completed 24  hours prior to her procedure.  The patient is scheduled and has  consented to undergo robotically-assisted hysterectomy at [facility] on October 31, 2007 at 1 p.m.      [place] J. Elizebeth Koller.      Dede Query Rivard, M.D.  Electronically Signed    EJP/MEDQ  D:  10/27/2007  T:  10/27/2007  Job:  [id number]
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
## 2010-07-14 NOTE — Op Note (Signed)
[name], [name]               ACCOUNT NO.:  [id number]   MEDICAL RECORD NO.:  [id number]          PATIENT TYPE:  OIB   LOCATION:  5093                         FACILITY:  [facility]   PHYSICIAN:  Dede Query. Rivard, M.D. DATE OF BIRTH:  [birth year]   DATE OF PROCEDURE:  10/31/2007  DATE OF DISCHARGE:                               OPERATIVE REPORT   PREOPERATIVE DIAGNOSIS:  Chronic pelvic pain with uterine fibroids and  menorrhagia.   POSTOPERATIVE DIAGNOSIS:  Chronic pelvic pain with uterine fibroids and  menorrhagia.   ANESTHESIA:  General, Dr. Glennon Mac.   PROCEDURE:  Laparoscopy total hysterectomy with robotic assistance.   SURGEON:  Dede Query. Rivard, M.D.   ASSISTANTJon Billings. Florene Glen, P.A.   ESTIMATED BLOOD LOSS:  Minimal.   PROCEDURE IN DETAIL:  After being informed of the planned procedure with  possible complications including bleeding, infection, injury to bowels,  bladder or ureters, informed consent is obtained.  The patient is taken  to OR #10 and given general anesthesia with endotracheal intubation  without any complication.  She is placed in the lithotomy position on  the sticky mattress, arms padded and tucked on each side with knee high  sequential compressive devices and chest taped to the table.  She is  prepped and draped in a sterile fashion and a Foley catheter is inserted  in her bladder.  Gyn exam reveals an anteverted uterus normal in size  and shape and very mobile and two normal adnexa.  A weighted speculum is  inserted.  The anterior lip of the cervix was grasped with a tenaculum  forceps and the uterus is sounded at 8 cm.  The cervix is easily dilated  using Hegar dilator until #27 which allows easy entry of a #8 RUMI  intrauterine manipulator with a 3.5 KOH ring and a vaginal occluder.  The RUMI balloon is inflated with 5 mL of saline.  The KOH ring is  sutured to the cervix using simple sutures of zero Vicryl.  The weighted  speculum is removed and  we proceed with the laparoscopy part.   The umbilical area is infiltrated with 5 mL of Marcaine 0.25 and we  perform a semi elliptical incision which was brought down sharply to the  fascia.  In that process we see a little fatty hernia which bulges out,  but the fascia is nevertheless identified, grasped with two Kocher  forceps and incised with Mayo scissors.  Peritoneum is entered bluntly.  We place a pursestring suture of zero Vicryl on the fascia and insert a  10 mm Hassan trocar which is held in place with the pursestring suture.  This allows easy insufflation of a pneumoperitoneum using CO2 at a  maximum pressure of 15 mmHg.   The camera is inserted.  Observation:  Anterior cul-de-sac and posterior  cul-de-sac are normal although we do see a left uterosacral ligament  peritoneal window.  There is no evidence of active lesions of  endometriosis.  Both tubes and both ovaries appear normal and both  ureters are seen very easily.   Placement of our trocars  is decided and we place one 8 mm robotic trocar  on the left, one 8 mm robotic trocar on the right and a 10 mm patient  side assistant trocar on the right.  We decide to proceed with the  forearm technique due to the small size of the uterus.  The robot is  then docked and we proceed with console time.   We start on the right side cauterizing the right tube using PK forceps  and sectioning with monopolar scissors.  We then cauterize the utero-  ovarian ligament and section with monopolar scissors.  We then cauterize  the round ligament and section with monopolar scissors.  This gives [place]  access to the broad ligament and the anterior sheath of the broad  ligament is sharply dissected all the way to across the uterus which  allows [place] to safely retract bladder bluntly above and beyond the KOH  ring which is easily identified.  The posterior sheath of the broad  ligament is then sharply dissected in order to skeletonize the uterine   vessels while we keep the ureter under easy visualization.  The uterine  vessels are then isolated at the level of the KOH ring and they can be  grasped with PK forceps and cauterized.  Again this was done safely away  from the ureter.   We proceed in the same fashion on the left side and we cauterize the  tube, the utero-ovarian ligament and the round ligament using PK forceps  and sectioned all of these with monopolar scissors.  Again, access is  easy to the anterior sheath of the broad ligament and we can then safely  complete our dissection of the bladder above and beyond the KOH ring.  The posterior sheath of the broad ligament is then dissected down  sharply keeping the ureter under visualization in order to fully  skeletonize the uterine artery.  It is again easily accessible at the  level of the KOH ring in its ascending branch and it is cauterized using  PK forceps.   We then insufflate the vaginal occluder and we perform a posterior  colpotomy using an opened monopolar scissor in the posterior cul-de-sac  initially.  This colpotomy is completed in a circumferential way in  order to free the uterus completely.  Some bleeding is encountered on  the left uterine pedicle which was cauterized using PK forceps as well  as fenestrated bipolar energy.  Again, the ureter was easily visualized  away from our site of cauterization.  The uterus with its cervix is  easily delivered through the vagina and instruments are modified for a  needle holder in arm #2 and a suture cut in arm #1.  Using zero Vicryl  we then close the vaginal vault using figure-of-eight stitches.  We then  irrigate profusely and notice satisfactory hemostasis.  Pneumoperitoneum  is reduced to again confirm satisfactory hemostasis.   Both ureters are easily visualized with a good peristalsis and no  dilatation and a full sheet of Interceed is then deposited on the  vaginal vault and posterior cul-de-sac.  The robot  was undocked, all  trocars are removed under direct visualization after evacuating the  pneumoperitoneum.   The fascia of the umbilical incision is closed with the previously  placed pursestring suture of zero Vicryl which automatically reduces the  hernia.   The fascia of the 10 mm incision is closed with a figure-of-eight stitch  of zero Vicryl and the skin of all  four incisions is then closed with  subcuticular suture of 3-0 Monocryl and Dermabond.   Instruments and sponge count is complete x2.  Estimated blood loss is  minimal.  The procedure is very well tolerated by the patient who is  taken to the recovery room in a well and stable condition.   SPECIMEN:  Uterus weighed 140 grams and was sent to pathology.      Dede Query Rivard, M.D.  Electronically Signed     SAR/MEDQ  D:  10/31/2007  T:  11/01/2007  Job:  [id number]

## 2010-07-14 NOTE — Op Note (Signed)
[name], [name]               ACCOUNT NO.:  [id number]   MEDICAL RECORD NO.:  [id number]          PATIENT TYPE:  AMB   LOCATION:  DAY                          FACILITY:  [facility]   PHYSICIAN:  Domingo Pulse, M.D.  DATE OF BIRTH:  Aug 05, 1975   DATE OF PROCEDURE:  06/09/2007  DATE OF DISCHARGE:                               OPERATIVE REPORT   PREOPERATIVE DIAGNOSIS:  Interstitial cystitis.   POSTOPERATIVE DIAGNOSIS:  Interstitial cystitis.   PROCEDURE:  Cystoscopy, urethral calibration, hydrodistention of the  bladder Marcaine and pyridium installation , Marcaine and Kenalog  injection, bladder biopsy.   SURGEON:  Alona Bene, M.D.   ANESTHESIA:  General.   COMPLICATIONS:  None.   DRAINS:  None.   BRIEF HISTORY:  This 35 year old female is felt to have possible  interstitial cystitis.  Her gynecologist felt that based on clinical  grounds, she likely had this disease.  We found the patient had elevated  PUF score.  She also had numerous conditions associated with IC  including fibromyalgia, irritable bowel syndrome and migraines.  The  patient is interested in formal examination and specifically wants to  have a biopsy done.  The patient understands the risks and benefits of  the procedure and gave full informed consent.   PROCEDURE:  After successful induction of general anesthesia, the  patient was placed in the dorsal lithotomy position, prepped with  Betadine and draped in the usual sterile fashion.  Care bimanual  examination showed no cystocele, rectocele, enterocele and no mass on  bimanual exam.  The urethra was palpated and normal, no signs of  diverticulum.  The urethra was of normal caliber accepting a 32-French  female urethral sound with no signs of stenosis or stricture.  Cystoscopy was performed.  The bladder was carefully inspected.  No  tumors or stones could be seen.  Both ureteral orifices were normal in  configuration and location.  Hydrodistention  of the bladder was then  performed.  The bladder was distended at a pressure of 100 cm of water  for 5 minutes.  When the bladder was drained, the patient had some  glomerulations.  There were no ulcers.  The patient had bladder capacity  of 700 mL which is diminished.  The patient had a bladder biopsy done.  The biopsy site was cauterized.  She tolerated the  procedure and was taken to the recovery room in good condition.  She did  receive intraoperative Toradol, Zofran the bladder instillation of  Marcaine and Pyridium in a pudendal block with Marcaine and Kenalog.  The patient tolerate the procedure and was taken to the recovery room in  good condition.      Domingo Pulse, M.D.  Electronically Signed     RJE/MEDQ  D:  06/09/2007  T:  06/09/2007  Job:  [id number]   cc:   Domingo Pulse, M.D.  Fax: [phone]

## 2010-07-17 NOTE — Op Note (Signed)
[name], [name]               ACCOUNT NO.:  [id number]   MEDICAL RECORD NO.:  [id number]          PATIENT TYPE:  AMB   LOCATION:  ENDO                         FACILITY:  [facility]   PHYSICIAN:  John C. Amedeo Plenty, M.D.    DATE OF BIRTH:  1975/03/28   DATE OF PROCEDURE:  07/06/2005  DATE OF DISCHARGE:                                 OPERATIVE REPORT   PROCEDURE:  Esophagogastroduodenoscopy.   ENDOSCOPIST:  Elyse Jarvis. Amedeo Plenty, M.D.   INDICATION FOR PROCEDURE:  Celiac disease with very atypical symptoms which  have not responded consistently to a gluten-free diet.  Procedure is to  assess for the integrity of the intestinal villi after 6 months of  prescribed gluten-free diet and to see if celiac is being adequately treated  or if there may be alternate causes of the patient's symptoms.   DESCRIPTION OF PROCEDURE:  The patient was placed in the left lateral  decubitus position and placed on the pulse monitor with continuous low-flow  oxygen delivered by nasal cannula.  She was sedated with 75 mcg IV fentanyl  and 8 mg IV Versed.  The Olympus videoendoscope was advanced under direct  vision into the oropharynx and esophagus.  The esophagus was straight and of  normal caliber with the squamocolumnar line at 38 cm.  There was no visible  hiatal hernia, ring, stricture or other abnormality of the GE junction.  The  stomach was entered and a small amount of liquid secretions were suctioned  from the fundus.  A retroflexed view of the cardia was unremarkable.  The  fundus, body, antrum and pylorus all appeared normal.  The duodenum was  entered and the bulb appeared normal.  The second and third portions did  show some slight mucosal scalloping, but the valvulae were for the most part  intact and were not significantly attenuated.  Three deep biopsies were  taken of the distal duodenum.  Scope was then withdrawn and the patient  returned to the recovery room in stable condition.  She tolerated the  procedure well and there were no immediate complications.   IMPRESSION:  Mucosal relief features of the duodenum consistent with celiac  disease, otherwise unremarkable study.   PLAN:  Await biopsy results.           ______________________________  Elyse Jarvis Amedeo Plenty, M.D.     JCH/MEDQ  D:  07/06/2005  T:  07/07/2005  Job:  [id number]   cc:   Elmarie Shiley, MD  Fax: [phone]

## 2010-07-17 NOTE — Op Note (Signed)
[facility]  Patient:    [name], [name] Visit Number: [id number] MRN: [id number]          Service Type: GYN Location: [id number] Attending Physician:  Edmonia Caprio Dictated by:   Pamalee Leyden, M.D. Proc. Date: 09/21/01 Admit Date:  09/19/2001 Discharge Date: 09/23/2001                             Operative Report  PROCEDURE:                    Diagnostic laparoscopy.  PREOPERATIVE DIAGNOSES:       Chronic pelvic pain.  Postoperative pelvic inflammatory disease.  POSTOPERATIVE DIAGNOSES:      Normal female pelvis.  DESCRIPTION OF PROCEDURE:     Under satisfactory general anesthesia with the patient in the dorsal lithotomy position, the perineum, vagina and abdomen were prepped and draped in the usual sterile manner.  Bimanual pelvic examination under anesthesia revealed the uterus in first-degree retroversion, freely moveable with normal free adnexa and of normal size, shape and consistency.  A bivalve speculum was placed to the vagina, and we could visualize the cervix.  A cervical clamp was placed on the cervix for mobilization of the uterus.  A Veress needle then inserted in the peritoneal cavity at the lower pole of the umbilicus.  Under control conditions, approximately 3 L of carbon dioxide were slowly insufflated into the peritoneal cavity.  A 1 cm transverse was made just below the umbilicus and a laparoscopic trocar inserted and a Z-Pap into the peritoneal cavity.  The trocar was removed the sleeve and laparoscope inserted.  The pelvic organs were easily visualized, both tubes showed signs of previous tubal ligation; but, the tubes were normal, as were the ovaries.  There was no sign of any adhesions.  There was no signs of endometriosis in the cul-de-sac or in the bladder flap, nor in the adjacent tissues.  The appendix was visualized and found to be within normal limits.  The entire area was then scanned and  no abnormalities were noted.  The scope was removed from the sleeve.  As much CO2 as possible was passed through the sleeve.  The sleeve removed and the patient transferred to the recovery room in satisfactory condition; after the tenaculum had been removed from the cervix. Dictated by:   Pamalee Leyden, M.D. Attending Physician:  Edmonia Caprio DD:  09/22/01 TD:  09/26/01 Job: [id number]
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
## 2010-07-17 NOTE — Op Note (Signed)
[name], [name]               ACCOUNT NO.:  [id number]   MEDICAL RECORD NO.:  [id number]          PATIENT TYPE:  AMB   LOCATION:  ENDO                         FACILITY:  [facility]   PHYSICIAN:  John C. Amedeo Plenty, M.D.    DATE OF BIRTH:  21-Jun-1975   DATE OF PROCEDURE:  11/26/2004  DATE OF DISCHARGE:                                 OPERATIVE REPORT   PROCEDURE:  Esophagogastroduodenoscopy with biopsy.   INDICATIONS FOR PROCEDURE:  Reported hematemesis with nausea, vomiting,  chronic reflux symptoms.   DESCRIPTION OF PROCEDURE:  The patient was placed in the left lateral  decubitus position then placed on the pulse monitor with continuous low-flow  oxygen delivered by nasal cannula. She was sedated with 50 mcg IV fentanyl  and 5 milligrams IV Versed. The Olympus video endoscope was advanced under  direct vision into the oropharynx and esophagus. The esophagus was straight  and of normal caliber with the squamocolumnar line at 38 cm above a 1-1/2 cm  hiatal hernia. The stomach was entered and a small amount of liquid  secretions were suctioned from the fundus. Retroflexed view of the cardia  confirmed a small hiatal hernia and was otherwise unremarkable. The fundus,  body, antrum and pylorus all appeared normal. A CLO-test was obtained but  after the procedure was overall, I was informed that the tissue was lost.  The duodenum was entered and both the bulb and second portion were well  inspected and appeared to be within normal limits. There did appear to be  some flattening of the valvulae possibly consistent with celiac disease and  small bowel biopsies were taken. The scope was then withdrawn and the  patient returned to the recovery room in stable condition. She tolerated the  procedure well and there were no immediate complications.   IMPRESSION:  Small hiatal hernia otherwise normal study.   PLAN:  Will await small-bowel biopsies and will obtain H pylori antibody.     ______________________________  Elyse Jarvis Amedeo Plenty, M.D.     JCH/MEDQ  D:  11/26/2004  T:  11/26/2004  Job:  [id number]   cc:   Evette Doffing, M.D.  Fax: [phone]

## 2010-07-17 NOTE — Discharge Summary (Signed)
NAME:  [name], [name]                         ACCOUNT NO.:  [id number]   MEDICAL RECORD NO.:  [id number]                   PATIENT TYPE:  INP   LOCATION:  9319                                 FACILITY:  [facility]   PHYSICIAN:  Phil D. Kalman Shan, M.D.                  DATE OF BIRTH:  05-Apr-1975   DATE OF ADMISSION:  09/19/2001  DATE OF DISCHARGE:  09/22/2001                                 DISCHARGE SUMMARY   HISTORY OF PRESENT ILLNESS:  The patient is a 35 year old gravida 4 para 3-0-  1-3 who had her tubes tied after her last baby three years ago.  Since that  time, has had multiple hospital emergency room visits for pelvic  inflammatory disease.  Examination of past records could not verify the  diagnosis by any laboratory tests.   In this recent hospitalization, the patient was seen about a month ago at  [facility] and was treated with Flagyl.  She presented to the emergency  room at [facility] several days prior to her admission and was treated  with Rocephin and Zithromax for physical findings consistent with pelvic  inflammatory disease, although all laboratory tests came back normal.  Two  days later, she represented herself and was admitted with chronic pelvic  inflammatory disease and was treated with Cleocin and gentamicin and once  again on this visit, all laboratory findings were negative, normal white  count, normal sedimentation rate, normal pelvic sonogram, and as a last  resort on the day prior to admission, laparoscopy was undertaken with the  exception of tubal ligation findings, presented and was found to have a  completely normal pelvis with no signs of inflammation, endometriosis, and  the appendix was normal.  We discussed this with the patient.   The impression is she has somatization with many, many physical symptoms.  She is very difficult to get a history from or even a good explanation of  her symptoms. She has symptoms that go from her neck to her feet  stating she  will have a difficult time going home because she cannot walk.  Her  complaints far outweigh her physical findings and we told her we are  referring her to a pain clinic in [facility].  Will get that information  for her and I will call her during the week with that.   ADMITTING DIAGNOSIS:  Admission for pelvic pain, presumptive pelvic  inflammatory disease.   DISCHARGE DIAGNOSIS:  Normal female pelvis with somatic pain.                                                Phil D. Kalman Shan, M.D.    PDR/MEDQ  D:  09/23/2001  T:  10/01/2001  Job:  [id number]  cc:   Franchot Heidelberg. Kalman Shan, M.D.  [address]  [place]  [place] [address]  Fax: [phone]
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
## 2010-07-17 NOTE — Op Note (Signed)
NAME:  [name], [name]               ACCOUNT NO.:  [id number]   MEDICAL RECORD NO.:  [id number]          PATIENT TYPE:  AMB   LOCATION:  [place]                          FACILITY:  [facility]   PHYSICIAN:  Jefry H. Constance Holster, MD     DATE OF BIRTH:  May 30, 1975   DATE OF PROCEDURE:  02/07/2006  DATE OF DISCHARGE:                               OPERATIVE REPORT   PREOPERATIVE DIAGNOSES:  1. Nasal septal deviation.  2. Inferior turbinate hypertrophy.   POSTOPERATIVE DIAGNOSES:  1. Nasal septal deviation.  2. Inferior turbinate hypertrophy.   PROCEDURES:  1. Nasal septoplasty.  2. Submucous resection inferior turbinates bilaterally.   SURGEON:  Jefry H. Constance Holster, MD   ANESTHESIA:  General endotracheal anesthesia was used.   COMPLICATIONS:  No complications.   ESTIMATED BLOOD LOSS:  Blood loss minimal.   FINDINGS:  Severe bony thickening and elongation of the inferior  turbinates bilaterally.  There was a leftward deflection of the entire  bony septum, including the maxillary crest, the vomer and the ethmoid  plate.  The cartilage was in good position.   REFERRING PHYSICIAN:  None.   HISTORY:  This is a 35 year old with a long history of nasal problems,  including epistaxis and difficulty with nasal obstruction.  Risks,  benefits, alternatives and complications of the procedure were explained  to the patient, who seemed to understand and agreed to surgery.   PROCEDURE:  The patient was taken to the operating room and placed on  the operating table in supine position.  Following induction of general  endotracheal anesthesia, the patient was prepped and draped in a  standard fashion.  Afrin spray was used preoperatively.  Xylocaine 1%  with epinephrine was infiltrated into the septum, the columella and the  inferior turbinates bilaterally.  Afrin-soaked pledgets were then placed  in nasal cavities.   A left hemitransfixion incision was used to approach the septal  cartilage, and a  mucoperichondrial flap was developed posteriorly down  the left side to the sphenoid rostrum.  There was a minor tear that  occurred during the elevation of the flap.  There was no loss of tissue.  The bony cartilaginous junction was divided, and a similar flap was  developed down the right side.  The superior and posterior attachments  of the ethmoid plate were taken down using a Jansen-Middleton rongeur.  The remaining attachments were taken down as well, and the entire bony  septum was resected.  A 4 mm osteotome was used to shave off part of the  left side of the maxillary crest to complete the reduction.  The mucosal  incision was then reapproximated with interrupted chromic suture.  The  septal flaps were quilted with plain gut.   Next, submucous resection of the inferior turbinates.  The leading edge  of the inferior turbinates was incised in a vertical fashion.  A 15  scalpel was used for this.  A Freer and Psychologist, educational were then used  to elevate mucosa off the bone in all directions, and large fragments of  bone were removed using Takahashi forceps.  The  mucosal remnants were  then lateralized using the Soil scientist.  The nasal cavities were  suctioned of blood and secretions, and packed with rolled up Telfa  coated with bacitracin.  The pharynx was suctioned as well.   The patient was then awakened, extubated and transferred to recovery in  stable condition.      Jefry H. Constance Holster, MD  Electronically Signed     JHR/MEDQ  D:  02/07/2006  T:  02/07/2006  Job:  [id number]

## 2010-07-21 ENCOUNTER — Emergency Department (HOSPITAL_COMMUNITY): Payer: Medicaid Other

## 2010-07-21 ENCOUNTER — Emergency Department (HOSPITAL_COMMUNITY)
Admission: EM | Admit: 2010-07-21 | Discharge: 2010-07-21 | Disposition: A | Discharge status: Home / Self Care | Payer: Medicaid Other | Attending: Emergency Medicine | Admitting: Emergency Medicine

## 2010-07-21 DIAGNOSIS — W268XXA Contact with other sharp object(s), not elsewhere classified, initial encounter: Secondary | ICD-10-CM | POA: Insufficient documentation

## 2010-07-21 DIAGNOSIS — S61209A Unspecified open wound of unspecified finger without damage to nail, initial encounter: Secondary | ICD-10-CM | POA: Insufficient documentation

## 2010-07-21 DIAGNOSIS — Y92009 Unspecified place in unspecified non-institutional (private) residence as the place of occurrence of the external cause: Secondary | ICD-10-CM | POA: Insufficient documentation

## 2010-10-03 ENCOUNTER — Emergency Department (HOSPITAL_COMMUNITY)
Admission: EM | Admit: 2010-10-03 | Discharge: 2010-10-03 | Disposition: A | Discharge status: Home / Self Care | Payer: Medicaid Other | Attending: Emergency Medicine | Admitting: Emergency Medicine

## 2010-10-03 DIAGNOSIS — Y93E9 Activity, other interior property and clothing maintenance: Secondary | ICD-10-CM | POA: Insufficient documentation

## 2010-10-03 DIAGNOSIS — Z23 Encounter for immunization: Secondary | ICD-10-CM | POA: Insufficient documentation

## 2010-10-03 DIAGNOSIS — S61409A Unspecified open wound of unspecified hand, initial encounter: Secondary | ICD-10-CM | POA: Insufficient documentation

## 2010-10-03 DIAGNOSIS — W268XXA Contact with other sharp object(s), not elsewhere classified, initial encounter: Secondary | ICD-10-CM | POA: Insufficient documentation

## 2010-10-03 DIAGNOSIS — J45909 Unspecified asthma, uncomplicated: Secondary | ICD-10-CM | POA: Insufficient documentation

## 2010-11-24 LAB — CBC
HCT: 40.6
Hemoglobin: 13.4
MCHC: 33.1
MCV: 90.7
Platelets: 200
RBC: 4.48
RDW: 12.7
WBC: 8.2

## 2010-11-24 LAB — BASIC METABOLIC PANEL WITH GFR
BUN: 11
Creatinine, Ser: 0.6
GFR calc non Af Amer: >60
Glucose, Bld: 106 — ABNORMAL HIGH

## 2010-11-24 LAB — COMPREHENSIVE METABOLIC PANEL
Alkaline Phosphatase: 56
BUN: 14
Calcium: 9
Creatinine, Ser: 0.67
Glucose, Bld: 87
Total Protein: 6.4

## 2010-11-24 LAB — BASIC METABOLIC PANEL
CO2: 26
Calcium: 9.3
Chloride: 105
GFR calc Af Amer: >60
Potassium: 3.1 — ABNORMAL LOW
Sodium: 139

## 2010-11-24 LAB — DIFFERENTIAL
Basophils Absolute: 0
Basophils Relative: 0
Eosinophils Absolute: 0
Eosinophils Relative: 1
Lymphocytes Relative: 48 — ABNORMAL HIGH
Lymphs Abs: 3.9
Monocytes Absolute: 0.5
Monocytes Relative: 6
Neutro Abs: 3.7
Neutrophils Relative %: 46

## 2010-11-24 LAB — PREGNANCY, URINE: Preg Test, Ur: NEGATIVE

## 2010-11-24 LAB — HEMOGLOBIN AND HEMATOCRIT, BLOOD
HCT: 39.8
Hemoglobin: 13.6

## 2010-11-24 LAB — D-DIMER, QUANTITATIVE: D-Dimer, Quant: <0.22

## 2010-12-01 LAB — CBC
HCT: 33.9 — ABNORMAL LOW
HCT: 36.9
Hemoglobin: 11.3 — ABNORMAL LOW
Hemoglobin: 12.4
MCHC: 33.4
MCHC: 33.6
RDW: 13.1
RDW: 13.3

## 2010-12-01 LAB — BASIC METABOLIC PANEL
CO2: 22
Calcium: 8.7
Glucose, Bld: 107 — ABNORMAL HIGH
Potassium: 3.6
Sodium: 136

## 2010-12-01 LAB — URINALYSIS, ROUTINE W REFLEX MICROSCOPIC
Bilirubin Urine: NEGATIVE
Ketones, ur: 15 — AB
Nitrite: NEGATIVE
Urobilinogen, UA: 0.2

## 2010-12-01 LAB — DIFFERENTIAL
Basophils Absolute: 0
Basophils Absolute: 0
Basophils Relative: 0
Eosinophils Relative: 0
Eosinophils Relative: 2
Lymphocytes Relative: 34
Monocytes Absolute: 0.6
Monocytes Absolute: 0.8
Monocytes Relative: 9

## 2010-12-01 LAB — URINE MICROSCOPIC-ADD ON

## 2010-12-02 LAB — DIFFERENTIAL
Basophils Absolute: 0
Basophils Relative: 0
Eosinophils Absolute: 0.2
Monocytes Absolute: 0.6
Monocytes Relative: 7
Neutrophils Relative %: 72

## 2010-12-02 LAB — URINE MICROSCOPIC-ADD ON

## 2010-12-02 LAB — COMPREHENSIVE METABOLIC PANEL
Albumin: 3.7
BUN: 19
Calcium: 9.4
Glucose, Bld: 105 — ABNORMAL HIGH
Total Protein: 6.7

## 2010-12-02 LAB — BASIC METABOLIC PANEL
BUN: 18
CO2: 27
Chloride: 102
GFR calc non Af Amer: >60
Glucose, Bld: 113 — ABNORMAL HIGH
Potassium: 3.2 — ABNORMAL LOW

## 2010-12-02 LAB — CBC
HCT: 35.1 — ABNORMAL LOW
Hemoglobin: 11.9 — ABNORMAL LOW
Hemoglobin: 12.9
MCHC: 33.4
MCV: 91.8
RBC: 3.84 — ABNORMAL LOW
RBC: 4.22
RDW: 12.5
RDW: 12.6

## 2010-12-02 LAB — URINALYSIS, ROUTINE W REFLEX MICROSCOPIC
Glucose, UA: NEGATIVE
Leukocytes, UA: NEGATIVE
Protein, ur: NEGATIVE
Specific Gravity, Urine: 1.02
pH: 6

## 2010-12-02 LAB — TYPE AND SCREEN

## 2010-12-11 LAB — URINALYSIS, ROUTINE W REFLEX MICROSCOPIC
Bilirubin Urine: NEGATIVE
Nitrite: NEGATIVE
Specific Gravity, Urine: 1.02
pH: 7.5

## 2010-12-11 LAB — I-STAT 8, (EC8 V) (CONVERTED LAB)
Acid-base deficit: 3 — ABNORMAL HIGH
Chloride: 107
HCT: 40
Operator id: 294501
Sodium: 140
pCO2, Ven: 42.1 — ABNORMAL LOW

## 2010-12-11 LAB — POCT CARDIAC MARKERS
CKMB, poc: <1 — ABNORMAL LOW
Operator id: 294501

## 2010-12-11 LAB — POCT I-STAT CREATININE: Creatinine, Ser: 0.7

## 2010-12-11 LAB — PREGNANCY, URINE: Preg Test, Ur: NEGATIVE

## 2010-12-14 LAB — POCT URINALYSIS DIP (DEVICE)
Bilirubin Urine: NEGATIVE
Glucose, UA: NEGATIVE
Nitrite: NEGATIVE

## 2010-12-15 LAB — DIFFERENTIAL
Basophils Relative: 1
Eosinophils Absolute: 0.1
Eosinophils Relative: 1
Monocytes Relative: 5
Neutrophils Relative %: 60

## 2010-12-15 LAB — COMPREHENSIVE METABOLIC PANEL
ALT: 10
Alkaline Phosphatase: 42
CO2: 26
GFR calc non Af Amer: >60
Glucose, Bld: 99
Potassium: 3.9
Sodium: 138

## 2010-12-15 LAB — URINALYSIS, ROUTINE W REFLEX MICROSCOPIC
Bilirubin Urine: NEGATIVE
Glucose, UA: NEGATIVE
Ketones, ur: NEGATIVE
Protein, ur: NEGATIVE

## 2010-12-15 LAB — POCT CARDIAC MARKERS
CKMB, poc: <1 — ABNORMAL LOW
Myoglobin, poc: 17
Troponin i, poc: <0.05

## 2010-12-15 LAB — CBC
Hemoglobin: 13.2
RBC: 4.36
WBC: 7.9

## 2010-12-17 LAB — POCT CARDIAC MARKERS
CKMB, poc: <1 — ABNORMAL LOW
CKMB, poc: <1 — ABNORMAL LOW
Myoglobin, poc: 25.7
Myoglobin, poc: 29.7
Myoglobin, poc: 32.7
Operator id: 277751
Operator id: 277751
Troponin i, poc: <0.05

## 2010-12-17 LAB — BASIC METABOLIC PANEL
CO2: 26
Calcium: 9.1
Creatinine, Ser: 0.61
GFR calc Af Amer: >60
GFR calc non Af Amer: >60
Sodium: 136

## 2010-12-17 LAB — URINALYSIS, ROUTINE W REFLEX MICROSCOPIC
Bilirubin Urine: NEGATIVE
Ketones, ur: NEGATIVE
Nitrite: NEGATIVE
Protein, ur: NEGATIVE

## 2010-12-17 LAB — I-STAT 8, (EC8 V) (CONVERTED LAB)
BUN: 18
Chloride: 107
Glucose, Bld: 82
Potassium: 3.7
pCO2, Ven: 40.6 — ABNORMAL LOW
pH, Ven: 7.366 — ABNORMAL HIGH

## 2010-12-17 LAB — URINE MICROSCOPIC-ADD ON

## 2011-01-31 ENCOUNTER — Encounter: Payer: Self-pay | Admitting: *Deleted

## 2011-01-31 ENCOUNTER — Emergency Department (HOSPITAL_COMMUNITY)
Admission: EM | Admit: 2011-01-31 | Discharge: 2011-02-01 | Disposition: A | Discharge status: Home / Self Care | Payer: Medicaid Other | Attending: Emergency Medicine | Admitting: Emergency Medicine

## 2011-01-31 DIAGNOSIS — R509 Fever, unspecified: Secondary | ICD-10-CM | POA: Insufficient documentation

## 2011-01-31 DIAGNOSIS — K5289 Other specified noninfective gastroenteritis and colitis: Secondary | ICD-10-CM | POA: Insufficient documentation

## 2011-01-31 DIAGNOSIS — K529 Noninfective gastroenteritis and colitis, unspecified: Secondary | ICD-10-CM

## 2011-01-31 HISTORY — DX: Celiac disease: K90.0

## 2011-01-31 HISTORY — DX: Pneumonia, unspecified organism: J18.9

## 2011-01-31 LAB — URINALYSIS, ROUTINE W REFLEX MICROSCOPIC
Leukocytes, UA: NEGATIVE
Protein, ur: NEGATIVE mg/dL
Specific Gravity, Urine: 1.035 — ABNORMAL HIGH (ref 1.005–1.030)
Urobilinogen, UA: 0.2 mg/dL (ref 0.0–1.0)

## 2011-01-31 LAB — URINE MICROSCOPIC-ADD ON

## 2011-01-31 MED ORDER — ONDANSETRON HCL 4 MG/2ML IJ SOLN
4.0000 mg | Freq: Once | INTRAMUSCULAR | Status: AC
  Administered 2011-01-31: 4 mg via INTRAVENOUS
  Filled 2011-01-31: qty 2

## 2011-01-31 MED ORDER — METOCLOPRAMIDE HCL 5 MG/ML IJ SOLN
10.0000 mg | Freq: Once | INTRAMUSCULAR | Status: AC
  Administered 2011-01-31: 10 mg via INTRAVENOUS
  Filled 2011-01-31: qty 2

## 2011-01-31 MED ORDER — SODIUM CHLORIDE 0.9 % IV BOLUS (SEPSIS)
1000.0000 mL | Freq: Once | INTRAVENOUS | Status: AC
  Administered 2011-01-31: 1000 mL via INTRAVENOUS

## 2011-01-31 NOTE — ED Provider Notes (Addendum)
History     CSN: [id number] Arrival date & time: 01/31/2011  7:18 PM   First MD Initiated Contact with Patient 01/31/11 2211      Chief Complaint  Patient presents with  . Fever    last took tylenol x 5 hrs ago  . Nausea  . Emesis  . Cough    (Consider location/radiation/quality/duration/timing/severity/associated sxs/prior treatment) HPI.... complain of nausea and vomiting for 24 hour.  Feels dehydrated. Said she " passed out"  this morning. Also complains of fever and chills. No dysuria or flank pain. Not keeping fluids down. Nothing makes her symptoms better or worse.  Slight cramping  Past Medical History  Diagnosis Date  . Asthma   . Celiac disease   . Pneumonia     Past Surgical History  Procedure Date  . Abdominal hysterectomy   . Abdominal surgery   . Back surgery   . Fracture surgery     History reviewed. No pertinent family history.  History  Substance Use Topics  . Smoking status: Former Research scientist (life sciences)  . Smokeless tobacco: Not on file  . Alcohol Use: No    OB History    Grav Para Term Preterm Abortions TAB SAB Ect Mult Living                  Review of Systems  All other systems reviewed and are negative.    Allergies  Ivp dye; Shellfish allergy; Diphenhydramine hcl; Doxycycline; Hydrocodone-acetaminophen; Hydromorphone hcl; Metronidazole; Oxycodone-acetaminophen; Pantoprazole sodium; Promethazine hcl; and Propoxyphene n-acetaminophen  Home Medications   Current Outpatient Rx  Name Route Sig Dispense Refill  . ACETAMINOPHEN 500 MG PO TABS Oral Take 1,000 mg by mouth every 6 (six) hours as needed.      . ALBUTEROL SULFATE HFA 108 (90 BASE) MCG/ACT IN AERS Inhalation Inhale 2 puffs into the lungs every 6 (six) hours as needed.      . BUDESONIDE-FORMOTEROL FUMARATE 160-4.5 MCG/ACT IN AERO Inhalation Inhale 2 puffs into the lungs 2 (two) times daily.      . CYCLOBENZAPRINE HCL 10 MG PO TABS Oral Take 10 mg by mouth 3 (three) times daily as needed. Pain     . TIOTROPIUM BROMIDE MONOHYDRATE 18 MCG IN CAPS Inhalation Place 18 mcg into inhaler and inhale daily.        BP 122/82  Pulse 80  Temp(Src) 98.8 F (37.1 C) (Oral)  Resp 18  Wt 153 lb 1.6 oz (69.446 kg)  SpO2 99%  Physical Exam  Nursing note and vitals reviewed. Constitutional: She is oriented to person, place, and time. She appears well-developed and well-nourished.       Looks dehydrated  HENT:  Head: Normocephalic and atraumatic.  Eyes: Conjunctivae and EOM are normal. Pupils are equal, round, and reactive to light.  Neck: Normal range of motion. Neck supple.  Cardiovascular: Normal rate and regular rhythm.   Pulmonary/Chest: Effort normal and breath sounds normal.  Abdominal: Soft. Bowel sounds are normal.       No acute abdomen  Musculoskeletal: Normal range of motion.  Neurological: She is alert and oriented to person, place, and time.  Skin: Skin is warm and dry.  Psychiatric: She has a normal mood and affect.    ED Course  Procedures (including critical care time)  Labs Reviewed  URINALYSIS, ROUTINE W REFLEX MICROSCOPIC - Abnormal; Notable for the following:    Color, Urine AMBER (*) BIOCHEMICALS MAY BE AFFECTED BY COLOR   APPearance CLOUDY (*)    Specific  Gravity, Urine 1.035 (*)    Hgb urine dipstick TRACE (*)    Bilirubin Urine SMALL (*)    Ketones, ur 40 (*)    All other components within normal limits  URINE MICROSCOPIC-ADD ON - Abnormal; Notable for the following:    Squamous Epithelial / LPF FEW (*)    All other components within normal limits  CBC  DIFFERENTIAL  BASIC METABOLIC PANEL  PREGNANCY, URINE   No results found. Results for orders placed during the hospital encounter of 01/31/11  URINALYSIS, ROUTINE W REFLEX MICROSCOPIC      Component Value Range   Color, Urine AMBER (*) YELLOW    APPearance CLOUDY (*) CLEAR    Specific Gravity, Urine 1.035 (*) 1.005 - 1.030    pH 5.0  5.0 - 8.0    Glucose, UA NEGATIVE  NEGATIVE (mg/dL)   Hgb  urine dipstick TRACE (*) NEGATIVE    Bilirubin Urine SMALL (*) NEGATIVE    Ketones, ur 40 (*) NEGATIVE (mg/dL)   Protein, ur NEGATIVE  NEGATIVE (mg/dL)   Urobilinogen, UA 0.2  0.0 - 1.0 (mg/dL)   Nitrite NEGATIVE  NEGATIVE    Leukocytes, UA NEGATIVE  NEGATIVE   URINE MICROSCOPIC-ADD ON      Component Value Range   Squamous Epithelial / LPF FEW (*) RARE    WBC, UA 0-2  <3 (WBC/hpf)   RBC / HPF 0-2  <3 (RBC/hpf)   Urine-Other MUCOUS PRESENT    CBC      Component Value Range   WBC 8.2  4.0 - 10.5 (K/uL)   RBC 4.18  3.87 - 5.11 (MIL/uL)   Hemoglobin 12.4  12.0 - 15.0 (g/dL)   HCT 37.8  36.0 - 46.0 (%)   MCV 90.4  78.0 - 100.0 (fL)   MCH 29.7  26.0 - 34.0 (pg)   MCHC 32.8  30.0 - 36.0 (g/dL)   RDW 12.7  11.5 - 15.5 (%)   Platelets 194  150 - 400 (K/uL)  DIFFERENTIAL      Component Value Range   Neutrophils Relative 65  43 - 77 (%)   Neutro Abs 5.4  1.7 - 7.7 (K/uL)   Lymphocytes Relative 27  12 - 46 (%)   Lymphs Abs 2.2  0.7 - 4.0 (K/uL)   Monocytes Relative 7  3 - 12 (%)   Monocytes Absolute 0.6  0.1 - 1.0 (K/uL)   Eosinophils Relative 1  0 - 5 (%)   Eosinophils Absolute 0.1  0.0 - 0.7 (K/uL)   Basophils Relative 0  0 - 1 (%)   Basophils Absolute 0.0  0.0 - 0.1 (K/uL)  PREGNANCY, URINE      Component Value Range   Preg Test, Ur NEGATIVE      No diagnosis found.   Date: 02/01/2011  Rate: 70  Rhythm: normal sinus rhythm  QRS Axis: normal  Intervals: normal  ST/T Wave abnormalities: normal  Conduction Disutrbances:none  Narrative Interpretation:   Old EKG Reviewed: none available   MDM  Suspect viral gastroenteritis. Will hydrate, labs, urinalysis, pregnancy test. Will also need EKG secondary to alleged syncope   Recheck at 00 10;  feeling better, passing urine, no acute abdomen     Nat Christen, MD 01/31/11 0488  Nat Christen, MD 02/01/11 [id number]

## 2011-01-31 NOTE — ED Notes (Signed)
Pt c/o frequent sinus infections, s/s x 1 week. Pt has progressively worsened, developed fever, shakes, chills, n/v.

## 2011-02-01 LAB — BASIC METABOLIC PANEL
CO2: 24 mEq/L (ref 19–32)
Calcium: 9.2 mg/dL (ref 8.4–10.5)
Creatinine, Ser: 0.6 mg/dL (ref 0.50–1.10)
GFR calc non Af Amer: >90 mL/min (ref >90)
Sodium: 140 mEq/L (ref 135–145)

## 2011-02-01 LAB — CBC
HCT: 37.8 % (ref 36.0–46.0)
MCH: 29.7 pg (ref 26.0–34.0)
MCHC: 32.8 g/dL (ref 30.0–36.0)
MCV: 90.4 fL (ref 78.0–100.0)
Platelets: 194 10*3/uL (ref 150–400)
RDW: 12.7 % (ref 11.5–15.5)
WBC: 8.2 10*3/uL (ref 4.0–10.5)

## 2011-02-01 LAB — DIFFERENTIAL
Basophils Absolute: 0 10*3/uL (ref 0.0–0.1)
Basophils Relative: 0 % (ref 0–1)
Eosinophils Absolute: 0.1 10*3/uL (ref 0.0–0.7)
Eosinophils Relative: 1 % (ref 0–5)
Lymphocytes Relative: 27 % (ref 12–46)
Monocytes Absolute: 0.6 10*3/uL (ref 0.1–1.0)

## 2011-02-01 LAB — PREGNANCY, URINE: Preg Test, Ur: NEGATIVE

## 2011-02-01 MED ORDER — ONDANSETRON HCL 4 MG PO TABS: 4.0000 mg | ORAL_TABLET | Freq: Four times a day (QID) | ORAL | Status: AC

## 2011-02-01 NOTE — ED Provider Notes (Signed)
  Physical Exam  BP 113/81  Pulse 70  Temp(Src) 98.5 F (36.9 C) (Oral)  Resp 16  Wt 153 lb 1.6 oz (69.446 kg)  SpO2 100%  Physical Exam  ED Course  Procedures  MDM Received patient in signout from Dr. Lacinda Axon. She's had nausea vomiting. She does better after IV fluids. Has tolerated orals. She'll be discharged home.      Jasper Riling. Alvino Chapel, MD 02/01/11 [phone]

## 2012-07-28 ENCOUNTER — Inpatient Hospital Stay (HOSPITAL_COMMUNITY): Payer: Medicaid Other

## 2012-07-28 ENCOUNTER — Inpatient Hospital Stay (HOSPITAL_COMMUNITY)
Admission: AD | Admit: 2012-07-28 | Discharge: 2012-07-28 | Disposition: A | Discharge status: Home / Self Care | Payer: Medicaid Other | Source: Ambulatory Visit | Attending: Family Medicine | Admitting: Family Medicine

## 2012-07-28 ENCOUNTER — Encounter (HOSPITAL_COMMUNITY): Payer: Self-pay | Admitting: *Deleted

## 2012-07-28 DIAGNOSIS — N7011 Chronic salpingitis: Secondary | ICD-10-CM

## 2012-07-28 DIAGNOSIS — N949 Unspecified condition associated with female genital organs and menstrual cycle: Secondary | ICD-10-CM | POA: Insufficient documentation

## 2012-07-28 DIAGNOSIS — N7013 Chronic salpingitis and oophoritis: Secondary | ICD-10-CM

## 2012-07-28 DIAGNOSIS — Z90710 Acquired absence of both cervix and uterus: Secondary | ICD-10-CM | POA: Insufficient documentation

## 2012-07-28 DIAGNOSIS — R109 Unspecified abdominal pain: Secondary | ICD-10-CM | POA: Insufficient documentation

## 2012-07-28 LAB — URINALYSIS, ROUTINE W REFLEX MICROSCOPIC
Bilirubin Urine: NEGATIVE
Ketones, ur: NEGATIVE mg/dL
Nitrite: NEGATIVE
Protein, ur: NEGATIVE mg/dL
pH: 5.5 (ref 5.0–8.0)

## 2012-07-28 LAB — WET PREP, GENITAL
Clue Cells Wet Prep HPF POC: NONE SEEN
Trich, Wet Prep: NONE SEEN

## 2012-07-28 LAB — URINE MICROSCOPIC-ADD ON

## 2012-07-28 MED ORDER — HYDROMORPHONE HCL 2 MG PO TABS: 2.0000 mg | ORAL_TABLET | ORAL | Status: DC | PRN

## 2012-07-28 MED ORDER — AZITHROMYCIN 1 G PO PACK
1.0000 g | PACK | Freq: Once | ORAL | Status: AC
  Administered 2012-07-28: 1 g via ORAL
  Filled 2012-07-28: qty 1

## 2012-07-28 MED ORDER — CEFTRIAXONE SODIUM 250 MG IJ SOLR
250.0000 mg | Freq: Once | INTRAMUSCULAR | Status: AC
  Administered 2012-07-28: 250 mg via INTRAMUSCULAR
  Filled 2012-07-28: qty 250

## 2012-07-28 NOTE — MAU Note (Signed)
Pt having lower abd pain x 1 wk.  Pt was seen in the ED at [facility] was told she has a GI bleed.  Pt tried to get in with her GI doctor, unable at this time.  Hx Hysterectomy, still has ovaries.

## 2012-07-28 NOTE — MAU Note (Signed)
BOYFRIEND SITTING IN CHAIR - LYING HEAD ON PT- - RUBBER HER  -  PT LAUGHING-  RUBBING  BOYFRIEND

## 2012-07-28 NOTE — MAU Provider Note (Signed)
Chart reviewed and agree with management and plan.

## 2012-07-28 NOTE — MAU Provider Note (Signed)
History     CSN: [id number]  Arrival date and time: 07/28/12 0035   First Provider Initiated Contact with Patient 07/28/12 0146      Chief Complaint  Patient presents with  . Abdominal Pain   HPI This is a 37 y.o. female who presents with c/o lower abdominal pain. She is concerned she has ovarian cysts, as she has had them in the past. Has been seen this week with GI bleeding.  Waiting to see her GI doctor. She had a hysterectomy in 2009 for chronic pelvic pain which improved after her surgery. Has not seen DR Rivard in years and states they will not see her now , as Dr Cletis Media is not taking new patients and it has been too long. No fever. Takes ZOfran for nausea.  RN Note: PT SAYS SHE COME IN TO NIGHT . SAYS IT STARTED LAST Monday- SHE WAS TAKING HER MEDS FROM HOME- WITH NO RELIEF. SHE WENT TO [facility] ON Monday-- HAD RECTAL BLEEDING -- HAD CT- THEY SAW BLEEDING- TOLD HER SHE HAD GI BLEED- AND TO SEE HER DR. THEY GAVE HER MEDS- DICYCLIMINE. SHE CALLED HER DR TODAY- DR Woodson ON PCP- DR Paulene Floor IN [place] TO SEND HER RECORDS SAW GI DR- DOESN'T REMEMBER. DENIES RECTAL BLEEDING TONIGHT. SAYS HAS ABD PAIN TONIGHT. [facility] [facility] GAVE HER DILAUDID FOR PAIN -- IT HELPED A LOT.  OB History   Grav Para Term Preterm Abortions TAB SAB Ect Mult Living   4 3   1  1   3       Past Medical History  Diagnosis Date  . Asthma   . Celiac disease   . Pneumonia     Past Surgical History  Procedure Laterality Date  . Abdominal hysterectomy    . Abdominal surgery    . Fracture surgery      History reviewed. No pertinent family history.  History  Substance Use Topics  . Smoking status: Former Research scientist (life sciences)  . Smokeless tobacco: Not on file  . Alcohol Use: No    Allergies:  Allergies  Allergen Reactions  . Ivp Dye (Iodinated Diagnostic Agents) Anaphylaxis  . Shellfish Allergy Anaphylaxis    Tuna pork and peanut butter allergies   . Diphenhydramine  Hcl     REACTION: Unknown reaction  . Doxycycline     REACTION: Nausea, vomitting  . Hydrocodone-Acetaminophen     REACTION: Unknown reaction  . Hydromorphone Hcl     REACTION: Unknown reaction  . Metronidazole     REACTION: Intolerance  . Oxycodone-Acetaminophen     REACTION: Unknown reaction  . Pantoprazole Sodium     REACTION: unknown reaction  . Promethazine Hcl     REACTION: Intolerance  . Propoxyphene-Acetaminophen     REACTION: Unknown reaction    Prescriptions prior to admission  Medication Sig Dispense Refill  . acetaminophen (TYLENOL) 500 MG tablet Take 1,000 mg by mouth every 6 (six) hours as needed.        Marland Kitchen albuterol (PROVENTIL HFA;VENTOLIN HFA) 108 (90 BASE) MCG/ACT inhaler Inhale 2 puffs into the lungs every 6 (six) hours as needed.        . budesonide-formoterol (SYMBICORT) 160-4.5 MCG/ACT inhaler Inhale 2 puffs into the lungs 2 (two) times daily.        . ciprofloxacin (CIPRO) 250 MG/5ML (5%) SUSR Take by mouth.      . cyclobenzaprine (FLEXERIL) 10 MG tablet Take 10 mg by mouth 3 (three) times daily as  needed. Pain      . tiotropium (SPIRIVA) 18 MCG inhalation capsule Place 18 mcg into inhaler and inhale daily.          Review of Systems  Constitutional: Negative for fever, chills and malaise/fatigue.  Gastrointestinal: Positive for nausea, abdominal pain and constipation. Negative for vomiting and diarrhea.  Genitourinary: Negative for dysuria.  Musculoskeletal: Negative for myalgias.  Neurological: Negative for dizziness.   Physical Exam   Blood pressure 112/76, pulse 79, temperature 99.1 F (37.3 C), temperature source Oral, resp. rate 18, height 5' 6"  (1.676 m), weight 66.497 kg (146 lb 9.6 oz).  Physical Exam  Constitutional: She is oriented to person, place, and time. She appears well-developed and well-nourished. No distress (Does not appear to be in pain).  HENT:  Head: Normocephalic.  Cardiovascular: Normal rate.   Respiratory: Effort normal.   GI: Soft. She exhibits no distension and no mass. There is tenderness (tender bilaterally to palpation on pelvic). There is no rebound and no guarding.  Genitourinary: Vagina normal and uterus normal. No vaginal discharge found.  Cervix and uterus surgically absent   Musculoskeletal: Normal range of motion.  Neurological: She is alert and oriented to person, place, and time.  Skin: Skin is warm and dry.  Psychiatric: She has a normal mood and affect.    MAU Course  Procedures  MDM Results for orders placed during the hospital encounter of 07/28/12 (from the past 24 hour(s))  URINALYSIS, ROUTINE W REFLEX MICROSCOPIC     Status: Abnormal   Collection Time    07/28/12  1:00 AM      Result Value Range   Color, Urine YELLOW  YELLOW   APPearance CLEAR  CLEAR   Specific Gravity, Urine 1.025  1.005 - 1.030   pH 5.5  5.0 - 8.0   Glucose, UA NEGATIVE  NEGATIVE mg/dL   Hgb urine dipstick TRACE (*) NEGATIVE   Bilirubin Urine NEGATIVE  NEGATIVE   Ketones, ur NEGATIVE  NEGATIVE mg/dL   Protein, ur NEGATIVE  NEGATIVE mg/dL   Urobilinogen, UA 0.2  0.0 - 1.0 mg/dL   Nitrite NEGATIVE  NEGATIVE   Leukocytes, UA NEGATIVE  NEGATIVE  URINE MICROSCOPIC-ADD ON     Status: Abnormal   Collection Time    07/28/12  1:00 AM      Result Value Range   Squamous Epithelial / LPF RARE  RARE   WBC, UA    <3 WBC/hpf   Value: NO FORMED ELEMENTS SEEN ON URINE MICROSCOPIC EXAMINATION   RBC / HPF 0-2  <3 RBC/hpf   Bacteria, UA FEW (*) RARE  WET PREP, GENITAL     Status: Abnormal   Collection Time    07/28/12  1:50 AM      Result Value Range   Yeast Wet Prep HPF POC NONE SEEN  NONE SEEN   Trich, Wet Prep NONE SEEN  NONE SEEN   Clue Cells Wet Prep HPF POC NONE SEEN  NONE SEEN   WBC, Wet Prep HPF POC FEW (*) NONE SEEN   US Pelvis Complete  07/28/2012   *RADIOLOGY REPORT*  Clinical Data: Abdominal pain.  TRANSABDOMINAL AND TRANSVAGINAL ULTRASOUND OF PELVIS Technique:  Both transabdominal and transvaginal  ultrasound examinations of the pelvis were performed. Transabdominal technique was performed for global imaging of the pelvis including uterus, ovaries, adnexal regions, and pelvic cul-de-sac.  It was necessary to proceed with endovaginal exam following the transabdominal exam to visualize the right ovary and adnexa.  Comparison:  CT of the abdomen and pelvis performed 03/13/2008, and pelvic ultrasound performed 12/05/2007  Findings:  Uterus: Status post hysterectomy.  Right ovary:  A relatively small right-sided hydrosalpinx is noted, measuring 0.7 cm in width.  The right ovary is unremarkable in appearance, measuring 2.5 x 2.4 x 2.1 cm.  No suspicious adnexal masses are seen.  Left ovary: A relatively small left sided hydrosalpinx is noted, measuring 0.7 cm in width.  The left ovary is unremarkable in appearance, measuring 2.5 x 2.4 x 2.1 cm.  No suspicious adnexal masses are seen.  Other findings: No free fluid is seen within the pelvic cul-de-sac. Evaluation is mildly suboptimal due to significant overlying bowel gas.  IMPRESSION: Status post hysterectomy; no ovarian cysts seen.  Small bilateral hydrosalpinges visualized.  The ovaries are unremarkable in appearance.   Original Report Authenticated By: Santa Lighter, M.D.    Assessment and Plan  A:  Pelvic pain      Bilateral small hydrosalpinges      Known GI bleeding      S/p hysterectomy  P:  WIll treat presumptively for PID (cannot use doxy due to pt intolerance)       Rocephin and zithromax       Advised to keep appts with GI and family doctors      Will refer to GYN clinic for followup per pt request  [facility] 07/28/2012, 1:55 AM

## 2012-07-28 NOTE — MAU Note (Signed)
PT  SAYS  SHE COME IN TO NIGHT .  SAYS IT STARTED  LAST Monday-    SHE WAS TAKING HER MEDS FROM  HOME-  WITH NO RELIEF.     SHE WENT  TO [facility]  ON  Monday-- HAD RECTAL BLEEDING --  HAD CT- THEY  SAW BLEEDING- TOLD HER  SHE HAD GI BLEED- AND TO SEE  HER DR.  THEY GAVE  HER MEDS-  DICYCLIMINE.    SHE CALLED  HER  DR TODAY- DR [place] ON PCP-    DR Paulene Floor IN [place] TO SEND   HER RECORDS       SAW GI DR-  DOESN'T REMEMBER.     DENIES RECTAL BLEEDING TONIGHT.  SAYS  HAS ABD PAIN  TONIGHT.  [place]  GAVE HER DILAUDID FOR PAIN --  IT HELPED A LOT.

## 2012-07-29 LAB — GC/CHLAMYDIA PROBE AMP: GC Probe RNA: NEGATIVE

## 2012-08-25 ENCOUNTER — Encounter: Payer: Medicaid Other | Admitting: Advanced Practice Midwife

## 2012-08-25 HISTORY — PX: COLONOSCOPY: SHX174

## 2012-09-25 ENCOUNTER — Encounter: Payer: Self-pay | Admitting: Obstetrics and Gynecology

## 2012-09-25 ENCOUNTER — Ambulatory Visit (INDEPENDENT_AMBULATORY_CARE_PROVIDER_SITE_OTHER): Payer: Medicaid Other | Admitting: Obstetrics and Gynecology

## 2012-09-25 VITALS — BP 107/79 | HR 87 | Temp 99.5°F | Ht 66.0 in | Wt 149.8 lb

## 2012-09-25 DIAGNOSIS — R109 Unspecified abdominal pain: Secondary | ICD-10-CM

## 2012-09-25 LAB — POCT URINALYSIS DIP (DEVICE)
Glucose, UA: NEGATIVE mg/dL
Hgb urine dipstick: NEGATIVE
Specific Gravity, Urine: 1.02 (ref 1.005–1.030)
Urobilinogen, UA: 0.2 mg/dL (ref 0.0–1.0)

## 2012-09-25 MED ORDER — HYDROMORPHONE HCL 2 MG PO TABS: 2.0000 mg | ORAL_TABLET | ORAL | Status: DC | PRN

## 2012-09-25 NOTE — Progress Notes (Signed)
  Subjective:    Patient ID: [name], female    DOB: January 10, 1976, 37 y.o.   MRN: [id number]  HPI 37 yo [id number] presenting today as an MAU follow up for PID. Patient was treated for PID on 07/28/2012 with Azithromax and rocephin. Patient states that since her discharge from MAU her pain has gotten worst. She states that she thinks the fluid in her tube is causing her pain. She describes the pain as throbbing and pelvic pressure. It radiates up her right flank and right lower extremity. The only thing that makes her pain better is valium. The pain is worst with movement. Patient reports some constipation. She has had persistent episodes of rectal bleeding and had a colonoscopy a few weeks ago which was normal. Patient is to follow up with GI in the next few weeks  Past Medical History  Diagnosis Date  . Asthma   . Celiac disease   . Pneumonia    Past Surgical History  Procedure Laterality Date  . Abdominal hysterectomy    . Abdominal surgery    . Fracture surgery     No family history on file. History  Substance Use Topics  . Smoking status: Former Research scientist (life sciences)  . Smokeless tobacco: Not on file  . Alcohol Use: No      Review of Systems  All other systems reviewed and are negative.       Objective:   Physical Exam  GENERAL: Well-developed, well-nourished female in no acute distress.  ABDOMEN: Soft, nondistended, suprapubic tenderness. No organomegaly. PELVIC: Normal external female genitalia. Vagina is pink and rugated. No adnexal mass or tenderness. EXTREMITIES: No cyanosis, clubbing, or edema, 2+ distal pulses.     Assessment & Plan:  37 yo with pelvic pain - Will repeat pelvic ultrasound - advised to follow up with GI - Discussed with patient unlikely to have PID s/p hysterectomy and b/l hydrosalpinx may represent normal post op change.  - RTC prn - Patient will be contacted with any abnormal results

## 2012-09-25 NOTE — Progress Notes (Signed)
[name] reports pelvic pain is getting worse. States runs a low grade fever most of the time- 99.5-101

## 2012-09-25 NOTE — Addendum Note (Signed)
Addended by: Mora Bellman on: 09/25/2012 02:22 PM   Modules accepted: Orders

## 2012-09-26 ENCOUNTER — Other Ambulatory Visit: Payer: Self-pay | Admitting: Obstetrics and Gynecology

## 2012-09-27 ENCOUNTER — Ambulatory Visit (HOSPITAL_COMMUNITY)
Admission: RE | Admit: 2012-09-27 | Discharge: 2012-09-27 | Disposition: A | Discharge status: Home / Self Care | Payer: Medicaid Other | Source: Ambulatory Visit | Attending: Obstetrics and Gynecology | Admitting: Obstetrics and Gynecology

## 2012-09-27 DIAGNOSIS — R109 Unspecified abdominal pain: Secondary | ICD-10-CM

## 2012-09-27 DIAGNOSIS — N949 Unspecified condition associated with female genital organs and menstrual cycle: Secondary | ICD-10-CM | POA: Insufficient documentation

## 2012-09-27 DIAGNOSIS — Z90710 Acquired absence of both cervix and uterus: Secondary | ICD-10-CM | POA: Insufficient documentation

## 2012-09-27 DIAGNOSIS — N7013 Chronic salpingitis and oophoritis: Secondary | ICD-10-CM | POA: Insufficient documentation

## 2012-09-28 ENCOUNTER — Telehealth: Payer: Self-pay | Admitting: General Practice

## 2012-09-28 NOTE — Telephone Encounter (Signed)
Message copied by Shelly Coss on Thu Sep 28, 2012  4:53 PM ------      Message from: CONSTANT, [place]      Created: Thu Sep 28, 2012 10:32 AM       Please inform patient of normal pelvic ultrasound. Fluid in her tube appears to be smaller than previously seen. No surgical intervention needed. Follow up with GI. There is no gynecologic reason for her pain.            Thanks      Clinical cytogeneticist ------

## 2012-09-28 NOTE — Telephone Encounter (Signed)
Called patient and informed her of normal results. Patient verbalized understanding and had no further questions

## 2012-12-20 ENCOUNTER — Other Ambulatory Visit: Payer: Self-pay | Admitting: Gastroenterology

## 2013-01-24 ENCOUNTER — Other Ambulatory Visit: Payer: Self-pay | Admitting: Gastroenterology

## 2013-01-24 DIAGNOSIS — R109 Unspecified abdominal pain: Secondary | ICD-10-CM

## 2013-02-01 ENCOUNTER — Ambulatory Visit
Admission: RE | Admit: 2013-02-01 | Discharge: 2013-02-01 | Disposition: A | Discharge status: Home / Self Care | Payer: Medicaid Other | Source: Ambulatory Visit | Attending: Gastroenterology | Admitting: Gastroenterology

## 2013-02-01 DIAGNOSIS — R109 Unspecified abdominal pain: Secondary | ICD-10-CM

## 2013-11-23 ENCOUNTER — Other Ambulatory Visit: Payer: Self-pay | Admitting: Gastroenterology

## 2013-11-23 DIAGNOSIS — R1310 Dysphagia, unspecified: Secondary | ICD-10-CM

## 2013-11-28 ENCOUNTER — Ambulatory Visit
Admission: RE | Admit: 2013-11-28 | Discharge: 2013-11-28 | Disposition: A | Discharge status: Home / Self Care | Payer: Medicaid Other | Source: Ambulatory Visit | Attending: Gastroenterology | Admitting: Gastroenterology

## 2013-11-28 DIAGNOSIS — R1310 Dysphagia, unspecified: Secondary | ICD-10-CM

## 2013-11-29 ENCOUNTER — Other Ambulatory Visit (HOSPITAL_COMMUNITY): Payer: Self-pay | Admitting: Gastroenterology

## 2013-11-29 DIAGNOSIS — R1314 Dysphagia, pharyngoesophageal phase: Secondary | ICD-10-CM

## 2013-12-06 ENCOUNTER — Ambulatory Visit (HOSPITAL_COMMUNITY)
Admission: RE | Admit: 2013-12-06 | Discharge: 2013-12-06 | Disposition: A | Discharge status: Home / Self Care | Payer: Medicaid Other | Source: Ambulatory Visit | Attending: Gastroenterology | Admitting: Gastroenterology

## 2013-12-06 ENCOUNTER — Other Ambulatory Visit (HOSPITAL_COMMUNITY): Payer: Self-pay | Admitting: Gastroenterology

## 2013-12-06 DIAGNOSIS — J45909 Unspecified asthma, uncomplicated: Secondary | ICD-10-CM | POA: Diagnosis not present

## 2013-12-06 DIAGNOSIS — K900 Celiac disease: Secondary | ICD-10-CM | POA: Diagnosis not present

## 2013-12-06 DIAGNOSIS — R1310 Dysphagia, unspecified: Secondary | ICD-10-CM | POA: Diagnosis not present

## 2013-12-06 DIAGNOSIS — R1314 Dysphagia, pharyngoesophageal phase: Secondary | ICD-10-CM

## 2013-12-06 DIAGNOSIS — M797 Fibromyalgia: Secondary | ICD-10-CM | POA: Insufficient documentation
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
## 2013-12-31 ENCOUNTER — Encounter: Payer: Self-pay | Admitting: Obstetrics and Gynecology

## 2014-01-07 ENCOUNTER — Other Ambulatory Visit (HOSPITAL_COMMUNITY): Payer: Self-pay | Admitting: Gastroenterology

## 2014-01-07 DIAGNOSIS — R1310 Dysphagia, unspecified: Secondary | ICD-10-CM

## 2014-01-11 ENCOUNTER — Ambulatory Visit (HOSPITAL_COMMUNITY): Admission: RE | Admit: 2014-01-11 | Payer: Medicaid Other | Source: Ambulatory Visit

## 2014-01-22 ENCOUNTER — Ambulatory Visit (HOSPITAL_COMMUNITY)
Admission: RE | Admit: 2014-01-22 | Discharge: 2014-01-22 | Disposition: A | Discharge status: Home / Self Care | Payer: Medicaid Other | Source: Ambulatory Visit | Attending: Gastroenterology | Admitting: Gastroenterology

## 2014-01-22 DIAGNOSIS — R112 Nausea with vomiting, unspecified: Secondary | ICD-10-CM | POA: Diagnosis not present

## 2014-01-22 DIAGNOSIS — R1310 Dysphagia, unspecified: Secondary | ICD-10-CM | POA: Insufficient documentation

## 2014-01-22 MED ORDER — TECHNETIUM TC 99M SULFUR COLLOID
2.2000 | Freq: Once | INTRAVENOUS | Status: AC | PRN
  Administered 2014-01-22: 2.2 via ORAL

## 2014-01-23 ENCOUNTER — Other Ambulatory Visit: Payer: Self-pay | Admitting: Gastroenterology

## 2014-02-04 ENCOUNTER — Other Ambulatory Visit: Payer: Self-pay | Admitting: Gastroenterology

## 2014-02-04 ENCOUNTER — Other Ambulatory Visit (HOSPITAL_COMMUNITY): Payer: Self-pay | Admitting: Gastroenterology

## 2014-02-04 DIAGNOSIS — R112 Nausea with vomiting, unspecified: Secondary | ICD-10-CM

## 2014-02-04 DIAGNOSIS — R1110 Vomiting, unspecified: Secondary | ICD-10-CM

## 2014-02-05 ENCOUNTER — Telehealth: Payer: Self-pay | Admitting: Radiology

## 2014-02-05 NOTE — Progress Notes (Signed)
Pt is allergic to contrast and to Benadryl Scheduled for CT abd/pelvis with cx 02/12/14 at 12noon  Called to [facility] 02/05/14 at 1120 am: Prednisone 50 mg #3 Prilosec 20 mg #1 With instructions  Pt aware

## 2014-02-12 ENCOUNTER — Ambulatory Visit (HOSPITAL_COMMUNITY): Payer: Medicaid Other

## 2014-02-12 ENCOUNTER — Other Ambulatory Visit: Payer: Self-pay | Admitting: Gastroenterology

## 2014-02-12 DIAGNOSIS — R110 Nausea: Secondary | ICD-10-CM

## 2014-02-12 DIAGNOSIS — R112 Nausea with vomiting, unspecified: Secondary | ICD-10-CM

## 2014-02-13 ENCOUNTER — Ambulatory Visit (HOSPITAL_COMMUNITY): Payer: Medicaid Other

## 2014-02-15 ENCOUNTER — Ambulatory Visit
Admission: RE | Admit: 2014-02-15 | Discharge: 2014-02-15 | Disposition: A | Discharge status: Home / Self Care | Payer: Medicaid Other | Source: Ambulatory Visit | Attending: Gastroenterology | Admitting: Gastroenterology

## 2014-02-15 DIAGNOSIS — R110 Nausea: Secondary | ICD-10-CM

## 2014-02-15 DIAGNOSIS — R112 Nausea with vomiting, unspecified: Secondary | ICD-10-CM

## 2015-05-26 ENCOUNTER — Ambulatory Visit
Admission: RE | Admit: 2015-05-26 | Discharge: 2015-05-26 | Disposition: A | Discharge status: Home / Self Care | Payer: Medicaid Other | Source: Ambulatory Visit | Attending: Gastroenterology | Admitting: Gastroenterology

## 2015-05-26 ENCOUNTER — Other Ambulatory Visit: Payer: Self-pay | Admitting: Gastroenterology

## 2015-05-26 DIAGNOSIS — R059 Cough, unspecified: Secondary | ICD-10-CM

## 2015-05-26 DIAGNOSIS — R05 Cough: Secondary | ICD-10-CM

## 2015-06-02 ENCOUNTER — Other Ambulatory Visit: Payer: Self-pay | Admitting: Gastroenterology

## 2015-06-03 ENCOUNTER — Other Ambulatory Visit: Payer: Self-pay | Admitting: Gastroenterology

## 2015-06-04 ENCOUNTER — Other Ambulatory Visit: Payer: Self-pay | Admitting: Gastroenterology

## 2015-06-04 DIAGNOSIS — R1011 Right upper quadrant pain: Secondary | ICD-10-CM

## 2015-06-12 ENCOUNTER — Other Ambulatory Visit: Payer: Medicaid Other

## 2015-06-23 ENCOUNTER — Other Ambulatory Visit: Payer: Self-pay | Admitting: Gastroenterology

## 2015-06-23 ENCOUNTER — Ambulatory Visit
Admission: RE | Admit: 2015-06-23 | Discharge: 2015-06-23 | Disposition: A | Discharge status: Home / Self Care | Payer: Medicaid Other | Source: Ambulatory Visit | Attending: Gastroenterology | Admitting: Gastroenterology

## 2015-06-23 DIAGNOSIS — R1011 Right upper quadrant pain: Secondary | ICD-10-CM

## 2015-06-24 ENCOUNTER — Other Ambulatory Visit (HOSPITAL_COMMUNITY): Payer: Self-pay | Admitting: Gastroenterology

## 2015-06-24 DIAGNOSIS — K900 Celiac disease: Secondary | ICD-10-CM

## 2015-06-26 ENCOUNTER — Encounter (HOSPITAL_COMMUNITY)
Admission: RE | Admit: 2015-06-26 | Discharge: 2015-06-26 | Disposition: A | Discharge status: Home / Self Care | Payer: Medicaid Other | Source: Ambulatory Visit | Attending: Gastroenterology | Admitting: Gastroenterology

## 2015-06-26 DIAGNOSIS — K900 Celiac disease: Secondary | ICD-10-CM | POA: Diagnosis not present

## 2015-06-26 MED ORDER — TECHNETIUM TC 99M MEBROFENIN IV KIT
4.5000 | PACK | Freq: Once | INTRAVENOUS | Status: AC | PRN
  Administered 2015-06-26: 5 via INTRAVENOUS

## 2015-06-27 ENCOUNTER — Other Ambulatory Visit: Payer: Self-pay | Admitting: Gastroenterology

## 2015-06-27 DIAGNOSIS — R1011 Right upper quadrant pain: Secondary | ICD-10-CM

## 2015-07-08 ENCOUNTER — Other Ambulatory Visit: Payer: Self-pay | Admitting: Gastroenterology

## 2015-07-08 ENCOUNTER — Inpatient Hospital Stay: Admission: RE | Admit: 2015-07-08 | Payer: Medicaid Other | Source: Ambulatory Visit

## 2015-07-08 DIAGNOSIS — R1011 Right upper quadrant pain: Secondary | ICD-10-CM

## 2015-07-14 ENCOUNTER — Other Ambulatory Visit: Payer: Medicaid Other

## 2015-07-21 ENCOUNTER — Other Ambulatory Visit: Payer: Medicaid Other

## 2015-07-29 ENCOUNTER — Ambulatory Visit
Admission: RE | Admit: 2015-07-29 | Discharge: 2015-07-29 | Disposition: A | Discharge status: Home / Self Care | Payer: Medicaid Other | Source: Ambulatory Visit | Attending: Gastroenterology | Admitting: Gastroenterology

## 2015-07-29 DIAGNOSIS — R1011 Right upper quadrant pain: Secondary | ICD-10-CM

## 2016-10-06 ENCOUNTER — Telehealth: Payer: Self-pay | Admitting: Neurology

## 2016-10-06 ENCOUNTER — Ambulatory Visit: Payer: Medicaid Other | Admitting: Neurology

## 2016-10-06 NOTE — Telephone Encounter (Signed)
This patient did not show for a new patient appointment today.

## 2016-10-07 ENCOUNTER — Encounter: Payer: Self-pay | Admitting: Neurology

## 2016-12-01 ENCOUNTER — Encounter: Payer: Self-pay | Admitting: Neurology

## 2016-12-01 ENCOUNTER — Telehealth: Payer: Self-pay | Admitting: Neurology

## 2016-12-01 ENCOUNTER — Ambulatory Visit: Payer: Medicaid Other | Admitting: Neurology

## 2016-12-01 NOTE — Telephone Encounter (Signed)
This patient did not show for a new patient appointment today, this is the second no-show, the patient will be discharged from our practice.

## 2017-06-04 IMAGING — CR DG CHEST 2V
2 series · 2 of 2 positions shown · non-contrast
Comparison: 02/20/2015

CLINICAL DATA: Cough for several weeks.  Prior smoker.

EXAM:
CHEST  2 VIEW

[view not recorded (1 of 2)]
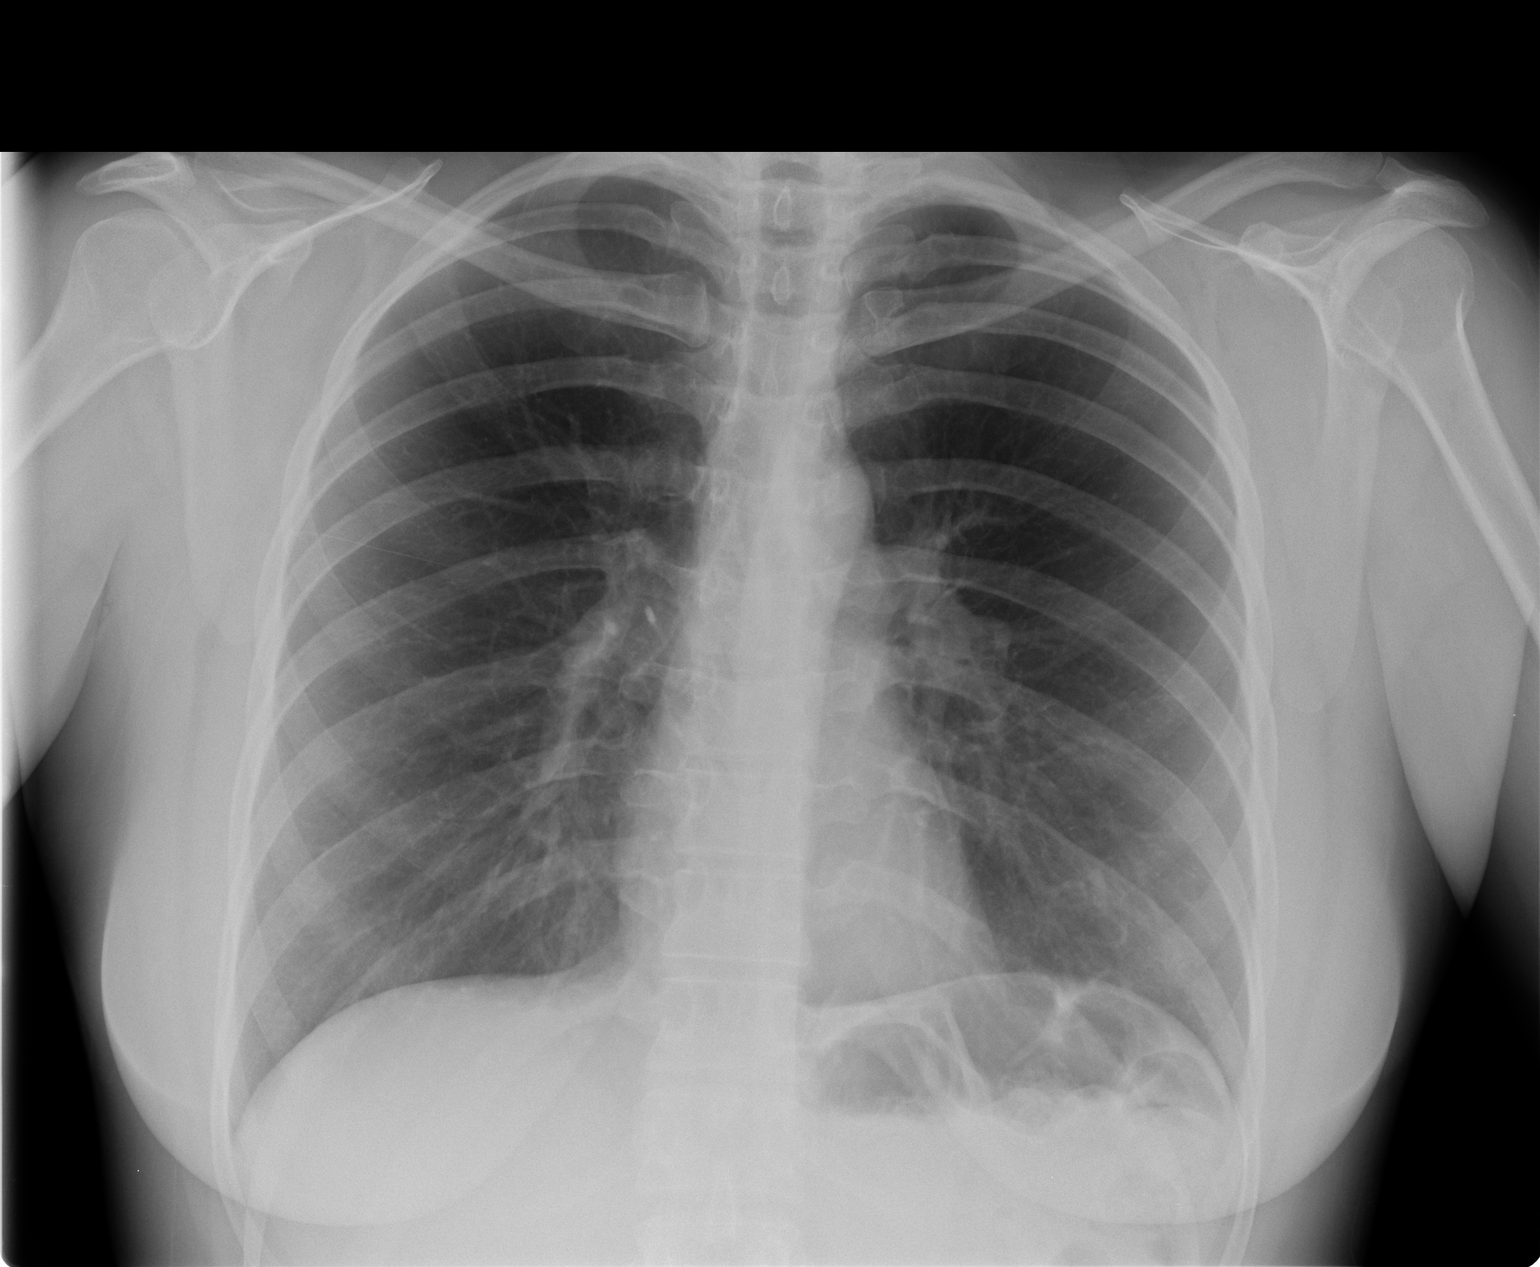

[view not recorded (2 of 2)]
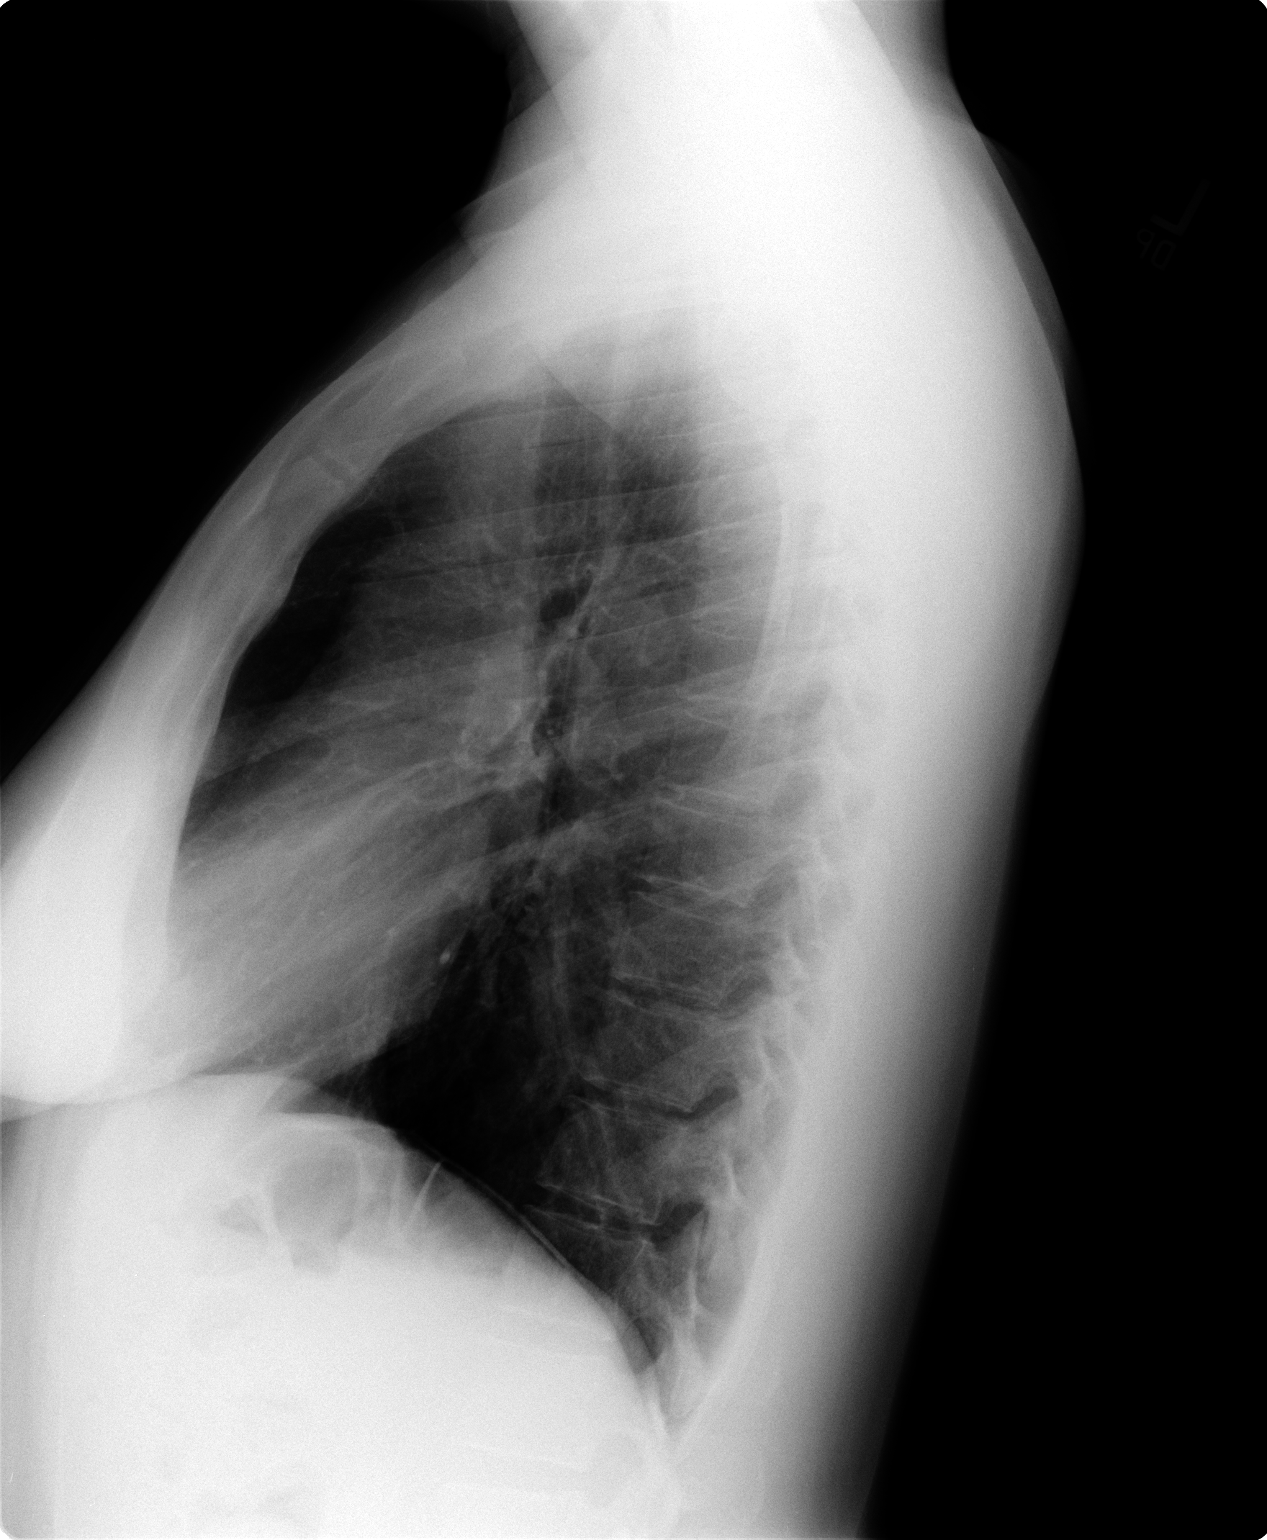

[2 of 2 positions shown; findings below may reference images not displayed]

FINDINGS: The heart size and mediastinal contours are within normal limits.
Both lungs are clear. The visualized skeletal structures are
unremarkable.
IMPRESSION: No active cardiopulmonary disease.

## 2017-08-06 ENCOUNTER — Ambulatory Visit (HOSPITAL_COMMUNITY)
Admission: EM | Admit: 2017-08-06 | Discharge: 2017-08-06 | Disposition: A | Discharge status: Home / Self Care | Payer: Medicaid Other | Attending: Internal Medicine | Admitting: Internal Medicine

## 2017-08-06 ENCOUNTER — Encounter (HOSPITAL_COMMUNITY): Payer: Self-pay

## 2017-08-06 ENCOUNTER — Other Ambulatory Visit: Payer: Self-pay

## 2017-08-06 ENCOUNTER — Telehealth (HOSPITAL_COMMUNITY): Payer: Self-pay | Admitting: Emergency Medicine

## 2017-08-06 DIAGNOSIS — Z91013 Allergy to seafood: Secondary | ICD-10-CM | POA: Diagnosis not present

## 2017-08-06 DIAGNOSIS — Z79899 Other long term (current) drug therapy: Secondary | ICD-10-CM | POA: Diagnosis not present

## 2017-08-06 DIAGNOSIS — R1310 Dysphagia, unspecified: Secondary | ICD-10-CM | POA: Diagnosis not present

## 2017-08-06 DIAGNOSIS — Z87891 Personal history of nicotine dependence: Secondary | ICD-10-CM | POA: Diagnosis not present

## 2017-08-06 DIAGNOSIS — Z90710 Acquired absence of both cervix and uterus: Secondary | ICD-10-CM | POA: Diagnosis not present

## 2017-08-06 DIAGNOSIS — J449 Chronic obstructive pulmonary disease, unspecified: Secondary | ICD-10-CM | POA: Insufficient documentation

## 2017-08-06 DIAGNOSIS — Z881 Allergy status to other antibiotic agents status: Secondary | ICD-10-CM | POA: Diagnosis not present

## 2017-08-06 DIAGNOSIS — Z885 Allergy status to narcotic agent status: Secondary | ICD-10-CM | POA: Insufficient documentation

## 2017-08-06 DIAGNOSIS — K900 Celiac disease: Secondary | ICD-10-CM | POA: Insufficient documentation

## 2017-08-06 DIAGNOSIS — R110 Nausea: Secondary | ICD-10-CM | POA: Insufficient documentation

## 2017-08-06 DIAGNOSIS — Z888 Allergy status to other drugs, medicaments and biological substances status: Secondary | ICD-10-CM | POA: Insufficient documentation

## 2017-08-06 DIAGNOSIS — Z7951 Long term (current) use of inhaled steroids: Secondary | ICD-10-CM | POA: Diagnosis not present

## 2017-08-06 DIAGNOSIS — Z91041 Radiographic dye allergy status: Secondary | ICD-10-CM | POA: Diagnosis not present

## 2017-08-06 DIAGNOSIS — Z886 Allergy status to analgesic agent status: Secondary | ICD-10-CM | POA: Insufficient documentation

## 2017-08-06 NOTE — Discharge Instructions (Addendum)
Rest and drink plenty of fluids Throat swab obtained.  We will call you with abnormal results Follow up with PCP for further evaluation and treatment Gastroenterology information given Return or go to the ER if you have any new or worsening symptoms

## 2017-08-06 NOTE — ED Triage Notes (Signed)
Pt presents today with some concerns of spitting up "tissue" that she states has blood in it. States it has been going on since earlier this morning. Has had some what of a sore throat. Did have a URI recently as well.

## 2017-08-06 NOTE — ED Provider Notes (Signed)
[facility]   [id number] 08/06/17 Arrival Time: 1843  SUBJECTIVE: History from: patient.  [name] is a 42 y.o. female hx significant for asthma, COPD, celiac disease who presents with intermittent spitting up "tissue" that began this morning.  Denies a precipitating event, drinking or eating anything out of the ordinary.  Denies recent alcohol use, or vomiting.  Reports improvement with warm liquids.  Denies aggravating factors.  Denies previous symptoms in the past.   Reports dysphagia, fatigue, and nausea.  Denies fever, chills, weight loss, or changes in bowel or bladder habits.    ROS: As per HPI.  Past Medical History:  Diagnosis Date  . Asthma   . Celiac disease   . COPD (chronic obstructive pulmonary disease) ([place])   . Pelvic pain   . Pneumonia    Past Surgical History:  Procedure Laterality Date  . ABDOMINAL HYSTERECTOMY    . ABDOMINAL SURGERY    . COLONOSCOPY  08/25/12  . FRACTURE SURGERY     Allergies  Allergen Reactions  . Aspirin Shortness Of Breath and Other (See Comments)    Severe stomach pain  . Ivp Dye [Iodinated Diagnostic Agents] Anaphylaxis  . Shellfish Allergy Anaphylaxis    Tuna pork and peanut butter allergies   . Diphenhydramine Hcl     REACTION: Unknown reaction  . Doxycycline     REACTION: Nausea, vomitting  . Hydrocodone-Acetaminophen     REACTION: Unknown reaction  . Hydromorphone Hcl     REACTION: Unknown reaction  . Metronidazole     REACTION: Intolerance  . Oxycodone-Acetaminophen     REACTION: Unknown reaction  . Pantoprazole Sodium     REACTION: unknown reaction  . Promethazine Hcl     REACTION: Intolerance  . Propoxyphene N-Acetaminophen     REACTION: Unknown reaction  . Lyrica [Pregabalin] Rash   No current facility-administered medications on file prior to encounter.    Current Outpatient Medications on File Prior to Encounter  Medication Sig Dispense Refill  . albuterol (PROVENTIL HFA;VENTOLIN HFA) 108  (90 BASE) MCG/ACT inhaler Inhale 2 puffs into the lungs every 6 (six) hours as needed.      . budesonide-formoterol (SYMBICORT) 160-4.5 MCG/ACT inhaler Inhale 2 puffs into the lungs 2 (two) times daily.      . fluticasone furoate-vilanterol (BREO ELLIPTA) 100-25 MCG/INH AEPB Inhale 1 puff into the lungs daily.    Marland Kitchen acetaminophen (TYLENOL) 500 MG tablet Take 1,000 mg by mouth every 6 (six) hours as needed.      . cyclobenzaprine (FLEXERIL) 10 MG tablet Take 10 mg by mouth 3 (three) times daily as needed. Pain    . HYDROmorphone (DILAUDID) 2 MG tablet Take 1 tablet (2 mg total) by mouth every 4 (four) hours as needed for pain. 30 tablet 0  . OVER THE COUNTER MEDICATION 1 tablet as needed. Laxative pill takes as needed    . tiotropium (SPIRIVA) 18 MCG inhalation capsule Place 18 mcg into inhaler and inhale daily.       Social History   Socioeconomic History  . Marital status: Married    Spouse name: Not on file  . Number of children: Not on file  . Years of education: Not on file  . Highest education level: Not on file  Occupational History  . Not on file  Social Needs  . Financial resource strain: Not on file  . Food insecurity:    Worry: Not on file    Inability: Not on file  . Transportation  needs:    Medical: Not on file    Non-medical: Not on file  Tobacco Use  . Smoking status: Former Research scientist (life sciences)  . Smokeless tobacco: Never Used  Substance and Sexual Activity  . Alcohol use: No  . Drug use: No  . Sexual activity: Yes    Birth control/protection: None, Surgical  Lifestyle  . Physical activity:    Days per week: Not on file    Minutes per session: Not on file  . Stress: Not on file  Relationships  . Social connections:    Talks on phone: Not on file    Gets together: Not on file    Attends religious service: Not on file    Active member of club or organization: Not on file    Attends meetings of clubs or organizations: Not on file    Relationship status: Not on file  .  Intimate partner violence:    Fear of current or ex partner: Not on file    Emotionally abused: Not on file    Physically abused: Not on file    Forced sexual activity: Not on file  Other Topics Concern  . Not on file  Social History Narrative  . Not on file   No family history on file.  OBJECTIVE:  Vitals:   08/06/17 1956  BP: 127/83  Pulse: 86  Resp: 16  Temp: 98.3 F (36.8 C)  TempSrc: Oral  SpO2: 100%     General appearance: AOx3 HEENT: Head: NCAT Ears: without deformity Eyes: PERRL, EOMI grossly; sinuses nontender; Nose: no rhinorrhea; Throat: oropharynx clear without erythema or obvious masses or lesion, tonsils nonerythematous without white tonsillar exudates, uvula midline Neck: LAD Lungs: CTA bilaterally without adventitious breath sounds Heart: regular rate and rhythm.  Radial pulses 2+ symmetrical bilaterally Abdomen: Normal active bowel sounds; nontender to palpation Skin: warm and dry Psychological: alert and cooperative; normal mood and affect  Lab Orders  No laboratory test(s) ordered today   ASSESSMENT & PLAN:  1. Dysphagia, unspecified type     No orders of the defined types were placed in this encounter.  Rest and drink plenty of fluids Throat swab obtained.  We will call you with abnormal results Follow up with PCP for further evaluation and treatment Gastroenterology information given Return or go to the ER if you have any new or worsening symptoms  Reviewed expectations re: course of current medical issues. Questions answered. Outlined signs and symptoms indicating need for more acute intervention. Patient verbalized understanding. After Visit Summary given.        Lestine Box, PA-C 08/06/17 2111

## 2017-08-06 NOTE — Telephone Encounter (Signed)
Patient would like all results to be called to the following number: [phone]

## 2017-08-08 LAB — CYTOLOGY, (ORAL, ANAL, URETHRAL) ANCILLARY ONLY
BACTERIAL VAGINITIS: NEGATIVE
CANDIDA VAGINITIS: NEGATIVE
CHLAMYDIA, DNA PROBE: NEGATIVE
Neisseria Gonorrhea: NEGATIVE
Trichomonas: NEGATIVE

## 2017-10-07 ENCOUNTER — Other Ambulatory Visit: Payer: Self-pay

## 2017-10-07 ENCOUNTER — Ambulatory Visit (HOSPITAL_COMMUNITY)
Admission: EM | Admit: 2017-10-07 | Discharge: 2017-10-07 | Disposition: A | Discharge status: Home / Self Care | Payer: Medicaid Other | Attending: Family Medicine | Admitting: Family Medicine

## 2017-10-07 ENCOUNTER — Encounter (HOSPITAL_COMMUNITY): Payer: Self-pay

## 2017-10-07 DIAGNOSIS — W57XXXA Bitten or stung by nonvenomous insect and other nonvenomous arthropods, initial encounter: Secondary | ICD-10-CM

## 2017-10-07 DIAGNOSIS — S90861A Insect bite (nonvenomous), right foot, initial encounter: Secondary | ICD-10-CM | POA: Diagnosis not present

## 2017-10-07 MED ORDER — ONDANSETRON 4 MG PO TBDP: 4.0000 mg | ORAL_TABLET | Freq: Three times a day (TID) | ORAL | 0 refills | Status: DC | PRN

## 2017-10-07 MED ORDER — IBUPROFEN 600 MG PO TABS: 600.0000 mg | ORAL_TABLET | Freq: Four times a day (QID) | ORAL | 0 refills | Status: AC | PRN

## 2017-10-07 MED ORDER — TRIAMCINOLONE ACETONIDE 0.1 % EX CREA: 1.0000 "application " | TOPICAL_CREAM | Freq: Two times a day (BID) | CUTANEOUS | 0 refills | Status: AC

## 2017-10-07 NOTE — ED Triage Notes (Signed)
Right foot spider bite. This happened Tuesday.

## 2017-10-07 NOTE — Discharge Instructions (Addendum)
This does not appear to be a dangerous spider bite Please use cream twice daily on area to help with itching and swelling Use anti-inflammatories for pain/swelling. You may take up to 800 mg Ibuprofen every 8 hours with food. You may supplement Ibuprofen with Tylenol [phone] mg every 8 hours.  Zofran as needed for nausea  Follow up if symptoms worsening, not improving, developing fever, worsening abdominal pain, numbness
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
## 2019-06-29 ENCOUNTER — Encounter: Payer: Self-pay | Admitting: Emergency Medicine

## 2019-06-29 ENCOUNTER — Other Ambulatory Visit: Payer: Self-pay

## 2019-06-29 ENCOUNTER — Emergency Department
Admission: EM | Admit: 2019-06-29 | Discharge: 2019-06-29 | Disposition: A | Discharge status: Home / Self Care | Payer: Medicaid Other | Attending: Emergency Medicine | Admitting: Emergency Medicine

## 2019-06-29 DIAGNOSIS — M546 Pain in thoracic spine: Secondary | ICD-10-CM | POA: Insufficient documentation

## 2019-06-29 DIAGNOSIS — Z91040 Latex allergy status: Secondary | ICD-10-CM | POA: Insufficient documentation

## 2019-06-29 DIAGNOSIS — Z91010 Allergy to peanuts: Secondary | ICD-10-CM | POA: Insufficient documentation

## 2019-06-29 DIAGNOSIS — M25512 Pain in left shoulder: Secondary | ICD-10-CM | POA: Insufficient documentation

## 2019-06-29 DIAGNOSIS — J45909 Unspecified asthma, uncomplicated: Secondary | ICD-10-CM | POA: Diagnosis not present

## 2019-06-29 DIAGNOSIS — Z79899 Other long term (current) drug therapy: Secondary | ICD-10-CM | POA: Diagnosis not present

## 2019-06-29 DIAGNOSIS — Z87891 Personal history of nicotine dependence: Secondary | ICD-10-CM | POA: Insufficient documentation

## 2019-06-29 DIAGNOSIS — J449 Chronic obstructive pulmonary disease, unspecified: Secondary | ICD-10-CM | POA: Diagnosis not present

## 2019-06-29 MED ORDER — KETOROLAC TROMETHAMINE 30 MG/ML IJ SOLN
30.0000 mg | Freq: Once | INTRAMUSCULAR | Status: AC
  Administered 2019-06-29: 21:00:00 30 mg via INTRAMUSCULAR
  Filled 2019-06-29: qty 1

## 2019-06-29 MED ORDER — KETOROLAC TROMETHAMINE 10 MG PO TABS: 10.0000 mg | ORAL_TABLET | Freq: Four times a day (QID) | ORAL | 0 refills | Status: AC | PRN

## 2019-06-29 MED ORDER — METHOCARBAMOL 500 MG PO TABS: 500.0000 mg | ORAL_TABLET | Freq: Three times a day (TID) | ORAL | 0 refills | Status: AC | PRN
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
## 2020-12-19 ENCOUNTER — Encounter (HOSPITAL_COMMUNITY): Payer: Self-pay | Admitting: Emergency Medicine

## 2020-12-19 ENCOUNTER — Other Ambulatory Visit: Payer: Self-pay

## 2020-12-19 ENCOUNTER — Ambulatory Visit (HOSPITAL_COMMUNITY)
Admission: EM | Admit: 2020-12-19 | Discharge: 2020-12-19 | Disposition: A | Discharge status: Home / Self Care | Payer: Medicaid Other | Attending: Student | Admitting: Student

## 2020-12-19 DIAGNOSIS — H60392 Other infective otitis externa, left ear: Secondary | ICD-10-CM

## 2020-12-19 MED ORDER — AMOXICILLIN-POT CLAVULANATE 875-125 MG PO TABS: 1.0000 | ORAL_TABLET | Freq: Two times a day (BID) | ORAL | 0 refills | Status: AC

## 2020-12-19 NOTE — Discharge Instructions (Addendum)
-  Start the antibiotic-Augmentin (amoxicillin-clavulanate), 1 pill every 12 hours for 7 days.  You can take this with food like with breakfast and dinner. -Come back and see [place] if symptoms getting worse instead of better

## 2020-12-19 NOTE — ED Triage Notes (Signed)
Pt presents with c/o ear pair. Pt states she first noticed a bump in left ear then it has gotten progressively worse and now "swollen shut" Reports pain has become worse over the past 3 days and radiates to side of her head and jaw area.
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
## 2020-12-27 ENCOUNTER — Other Ambulatory Visit: Payer: Self-pay

## 2020-12-27 ENCOUNTER — Encounter (HOSPITAL_COMMUNITY): Payer: Self-pay | Admitting: Emergency Medicine

## 2020-12-27 ENCOUNTER — Emergency Department (HOSPITAL_COMMUNITY)
Admission: EM | Admit: 2020-12-27 | Discharge: 2020-12-27 | Disposition: A | Discharge status: Home / Self Care | Payer: Medicaid Other | Attending: Emergency Medicine | Admitting: Emergency Medicine

## 2020-12-27 DIAGNOSIS — H6092 Unspecified otitis externa, left ear: Secondary | ICD-10-CM | POA: Insufficient documentation

## 2020-12-27 DIAGNOSIS — J45909 Unspecified asthma, uncomplicated: Secondary | ICD-10-CM | POA: Diagnosis not present

## 2020-12-27 DIAGNOSIS — Z87891 Personal history of nicotine dependence: Secondary | ICD-10-CM | POA: Insufficient documentation

## 2020-12-27 DIAGNOSIS — H60502 Unspecified acute noninfective otitis externa, left ear: Secondary | ICD-10-CM

## 2020-12-27 DIAGNOSIS — J449 Chronic obstructive pulmonary disease, unspecified: Secondary | ICD-10-CM | POA: Insufficient documentation

## 2020-12-27 DIAGNOSIS — Z91040 Latex allergy status: Secondary | ICD-10-CM | POA: Insufficient documentation

## 2020-12-27 DIAGNOSIS — H9202 Otalgia, left ear: Secondary | ICD-10-CM | POA: Diagnosis present

## 2020-12-27 DIAGNOSIS — Z7951 Long term (current) use of inhaled steroids: Secondary | ICD-10-CM | POA: Insufficient documentation

## 2020-12-27 MED ORDER — ACETIC ACID 2 % OT SOLN: 4.0000 [drp] | OTIC | 0 refills | Status: AC
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
## 2020-12-27 NOTE — ED Triage Notes (Signed)
PT to triage via [facility] from home.  Reports L ear pain and bilateral rib pain x 1 month.  Seen at [facility] 10/21 and completed Augmentin and states pain is worse.

## 2022-01-21 ENCOUNTER — Encounter (HOSPITAL_COMMUNITY): Payer: Self-pay

## 2022-01-21 ENCOUNTER — Emergency Department (HOSPITAL_COMMUNITY)
Admission: EM | Admit: 2022-01-21 | Discharge: 2022-01-21 | Disposition: A | Discharge status: Home / Self Care | Payer: Medicaid Other | Attending: Emergency Medicine | Admitting: Emergency Medicine

## 2022-01-21 DIAGNOSIS — Z91010 Allergy to peanuts: Secondary | ICD-10-CM | POA: Insufficient documentation

## 2022-01-21 DIAGNOSIS — J45909 Unspecified asthma, uncomplicated: Secondary | ICD-10-CM | POA: Diagnosis not present

## 2022-01-21 DIAGNOSIS — J449 Chronic obstructive pulmonary disease, unspecified: Secondary | ICD-10-CM | POA: Insufficient documentation

## 2022-01-21 DIAGNOSIS — Z7951 Long term (current) use of inhaled steroids: Secondary | ICD-10-CM | POA: Diagnosis not present

## 2022-01-21 DIAGNOSIS — K920 Hematemesis: Secondary | ICD-10-CM | POA: Diagnosis not present

## 2022-01-21 DIAGNOSIS — R112 Nausea with vomiting, unspecified: Secondary | ICD-10-CM

## 2022-01-21 DIAGNOSIS — R519 Headache, unspecified: Secondary | ICD-10-CM | POA: Diagnosis not present

## 2022-01-21 DIAGNOSIS — Z91040 Latex allergy status: Secondary | ICD-10-CM | POA: Insufficient documentation

## 2022-01-21 DIAGNOSIS — E876 Hypokalemia: Secondary | ICD-10-CM | POA: Insufficient documentation

## 2022-01-21 LAB — BASIC METABOLIC PANEL
Anion gap: 10 (ref 5–15)
BUN: 19 mg/dL (ref 6–20)
CO2: 25 mmol/L (ref 22–32)
Calcium: 9.8 mg/dL (ref 8.9–10.3)
Chloride: 106 mmol/L (ref 98–111)
Creatinine, Ser: 0.69 mg/dL (ref 0.44–1.00)
GFR, Estimated: >60 mL/min (ref >60)
Glucose, Bld: 110 mg/dL — ABNORMAL HIGH (ref 70–99)
Potassium: 3.3 mmol/L — ABNORMAL LOW (ref 3.5–5.1)
Sodium: 141 mmol/L (ref 135–145)

## 2022-01-21 LAB — HEMOGLOBIN AND HEMATOCRIT, BLOOD
HCT: 41.2 % (ref 36.0–46.0)
Hemoglobin: 13.2 g/dL (ref 12.0–15.0)

## 2022-01-21 LAB — CBC WITH DIFFERENTIAL/PLATELET
Abs Immature Granulocytes: 0.02 10*3/uL (ref 0.00–0.07)
Basophils Absolute: 0 10*3/uL (ref 0.0–0.1)
Basophils Relative: 0 %
Eosinophils Absolute: 0 10*3/uL (ref 0.0–0.5)
Eosinophils Relative: 0 %
HCT: 44.4 % (ref 36.0–46.0)
Hemoglobin: 14.2 g/dL (ref 12.0–15.0)
Immature Granulocytes: 0 %
Lymphocytes Relative: 27 %
Lymphs Abs: 2.9 10*3/uL (ref 0.7–4.0)
MCH: 29.6 pg (ref 26.0–34.0)
MCHC: 32 g/dL (ref 30.0–36.0)
MCV: 92.7 fL (ref 80.0–100.0)
Monocytes Absolute: 0.8 10*3/uL (ref 0.1–1.0)
Monocytes Relative: 8 %
Neutro Abs: 7 10*3/uL (ref 1.7–7.7)
Neutrophils Relative %: 65 %
Platelets: 228 10*3/uL (ref 150–400)
RBC: 4.79 MIL/uL (ref 3.87–5.11)
RDW: 12.9 % (ref 11.5–15.5)
WBC: 10.8 10*3/uL — ABNORMAL HIGH (ref 4.0–10.5)
nRBC: 0 % (ref 0.0–0.2)

## 2022-01-21 LAB — TYPE AND SCREEN
ABO/RH(D): O POS
Antibody Screen: NEGATIVE

## 2022-01-21 MED ORDER — LACTATED RINGERS IV BOLUS
1000.0000 mL | Freq: Once | INTRAVENOUS | Status: AC
  Administered 2022-01-21: 1000 mL via INTRAVENOUS

## 2022-01-21 MED ORDER — FAMOTIDINE 20 MG PO TABS: 20.0000 mg | ORAL_TABLET | Freq: Two times a day (BID) | ORAL | 0 refills | Status: AC

## 2022-01-21 MED ORDER — ONDANSETRON 4 MG PO TBDP: ORAL_TABLET | ORAL | 0 refills | Status: AC

## 2022-01-21 MED ORDER — POTASSIUM CHLORIDE CRYS ER 20 MEQ PO TBCR
40.0000 meq | EXTENDED_RELEASE_TABLET | Freq: Once | ORAL | Status: AC
  Administered 2022-01-21: 40 meq via ORAL
  Filled 2022-01-21: qty 2

## 2022-01-21 MED ORDER — FAMOTIDINE IN NACL 20-0.9 MG/50ML-% IV SOLN
20.0000 mg | Freq: Once | INTRAVENOUS | Status: AC
  Administered 2022-01-21: 20 mg via INTRAVENOUS
  Filled 2022-01-21: qty 50

## 2022-01-21 MED ORDER — METOCLOPRAMIDE HCL 5 MG/ML IJ SOLN
5.0000 mg | Freq: Once | INTRAMUSCULAR | Status: AC
  Administered 2022-01-21: 5 mg via INTRAVENOUS
  Filled 2022-01-21: qty 2

## 2022-01-21 MED ORDER — METOCLOPRAMIDE HCL 5 MG/ML IJ SOLN
10.0000 mg | Freq: Once | INTRAMUSCULAR | Status: AC
  Administered 2022-01-21: 10 mg via INTRAVENOUS
  Filled 2022-01-21: qty 2

## 2022-01-21 NOTE — ED Triage Notes (Signed)
Pt reports vomiting multiple times. Pt reports blood in vomit. Pt c/o Nausea Vomiting and headache. Pt reports that this headache is 10X worse than the migraines she gets normally. Pt had gull bladder removed and hernia repair at [facility] on 11/20.    BP 142/78 HR 86 Spo2 98%  CBG 124

## 2022-01-21 NOTE — ED Provider Notes (Signed)
Care taken over from Dr. Roxanne Mins.  Her hemoglobin is stable.  She is overall feeling better.  She still had a bit of a headache so her Reglan dose was repeated.  She has very extensive allergies and treatment options are limited.  Overall she is feeling better and is ready to go home.  Discharge instructions given per Dr. Roxanne Mins.  She was given prescriptions for Zofran and Pepcid.  Will follow-up with her PCP.  Discussed that she may need referral to GI.   Malvin Johns, MD 01/21/22 [phone]

## 2022-01-21 NOTE — ED Provider Notes (Signed)
[facility] DEPT Provider Note   CSN: [id number] Arrival date & time: 01/21/22  0222     History  Chief Complaint  Patient presents with   Headache   Nausea   Emesis    [name] is a 46 y.o. female.  The history is provided by the patient.  Headache Associated symptoms: vomiting   Emesis Associated symptoms: headaches   She has history of asthma, celiac disease, COPD and had a cholecystectomy done on 01/18/2022.  She has had nausea and vomiting since waking up from the procedure, and did not have any antiemetic at home.  She came in tonight because she vomited some bright red blood.  She is complaining of some epigastric pain which does radiate to the back to the shoulder.  She is also complaining of headache today.   Home Medications Prior to Admission medications   Medication Sig Start Date End Date Taking? Authorizing Provider  acetic acid 2 % otic solution Place 4 drops into the left ear every 3 (three) hours. 12/27/20   [name], MD  albuterol (PROVENTIL HFA;VENTOLIN HFA) 108 (90 BASE) MCG/ACT inhaler Inhale 2 puffs into the lungs every 6 (six) hours as needed.      [provider]  amoxicillin-clavulanate (AUGMENTIN) 875-125 MG tablet Take 1 tablet by mouth every 12 (twelve) hours. 12/19/20   Hazel Sams, PA-C  fluticasone furoate-vilanterol (BREO ELLIPTA) 100-25 MCG/INH AEPB Inhale 1 puff into the lungs daily.    [provider]  ibuprofen (ADVIL,MOTRIN) 600 MG tablet Take 1 tablet (600 mg total) by mouth every 6 (six) hours as needed. 10/07/17   Wieters, Hallie C, PA-C  ondansetron (ZOFRAN ODT) 4 MG disintegrating tablet Take 1 tablet (4 mg total) by mouth every 8 (eight) hours as needed for nausea or vomiting. 10/07/17   Wieters, Hallie C, PA-C  OVER THE COUNTER MEDICATION 1 tablet as needed. Laxative pill takes as needed    [provider]  triamcinolone cream (KENALOG) 0.1 % Apply 1 application  topically 2 (two) times daily. 10/07/17   Wieters, Hallie C, PA-C      Allergies    Amitriptyline, Aspirin, Ceftriaxone, Ciprofloxacin, Eggs or egg-derived products, Fish oil, Fish-derived products, Flavoring agent, Gabapentin, Hydroxyzine, Ibuprofen, Influenza vaccines, Ivp dye [iodinated contrast media], Levofloxacin, Morphine and related, Naproxen, Naproxen sodium, Peanut oil, Pentosan polysulfate, Pentosan polysulfate sodium, Pork-derived products, Prednisone, Shellfish allergy, Shellfish-derived products, Tape, Diphenhydramine hcl, Doxycycline, Hydrocodone-acetaminophen, Hydromorphone hcl, Metronidazole, Oxycodone-acetaminophen, Pantoprazole sodium, Promethazine hcl, Propoxyphene n-acetaminophen, Latex, and Lyrica [pregabalin]    Review of Systems   Review of Systems  Gastrointestinal:  Positive for vomiting.  Neurological:  Positive for headaches.  All other systems reviewed and are negative.   Physical Exam Updated Vital Signs BP (!) 135/99 (BP Location: Right Arm)   Pulse 91   Temp 98.2 F (36.8 C) (Oral)   Resp 18   Ht 5' 6"  (1.676 m)   Wt 83.5 kg   SpO2 98%   BMI 29.70 kg/m  Physical Exam Vitals and nursing note reviewed.   46 year old female, resting comfortably and in no acute distress. Vital signs are significant for elevated blood pressure. Oxygen saturation is 98%, which is normal. Head is normocephalic and atraumatic. PERRLA, EOMI. Oropharynx is clear. Neck is nontender and supple without adenopathy or JVD. Back is nontender and there is no CVA tenderness. Lungs are clear without rales, wheezes, or rhonchi. Chest is nontender. Heart has regular rate and rhythm without murmur.  Abdomen is soft, flat, with mild tenderness in the epigastric area.  There is no rebound or guarding.  Scars from recent laparoscopic cholecystectomy are present without signs of infection. Extremities have no cyanosis or edema, full range of motion is present. Skin is warm and dry without  rash. Neurologic: Mental status is normal, cranial nerves are intact, moves all extremities equally.  ED Results / Procedures / Treatments   Labs (all labs ordered are listed, but only abnormal results are displayed) Labs Reviewed - No data to display  EKG None  Radiology No results found.  Procedures Procedures  Cardiac monitor shows normal sinus rhythm, per my interpretation.  Medications Ordered in ED Medications - No data to display  ED Course/ Medical Decision Making/ A&P                           Medical Decision Making Amount and/or Complexity of Data Reviewed Labs: ordered.  Risk Prescription drug management.   Postoperative nausea and vomiting with hematemesis.  This is likely Mallory-Weiss tear.  Consider gastritis, peptic ulcer disease.  Doubt esophageal varices.  At this point, it does not appear to be a hemodynamically significant bleed as she has normal heart rate and actually elevated blood pressure.  Headache is nonspecific, but no red flags to suggest serious pathology such as intracranial hemorrhage, meningitis.  I have ordered IV fluids and metoclopramide which should treat nausea and headache.  Unfortunately, she is allergic to proton pump inhibitors so I have ordered a dose of famotidine.  I have ordered orthostatic vital signs be checked and I have ordered labs of CBC and basic metabolic panel.  Plan is to recheck hemoglobin in 4 hours to make sure that it is not dropping significantly.  I have reviewed her past records, and she had patient laparoscopic cholecystectomy with umbilical hernia repair on 01/18/2022-surgery appears to have been uncomplicated.  Following above-noted treatment, headache is improved and nausea is completely resolved.  I have reviewed and interpreted her laboratory test, and my interpretation is hemoglobin is normal and virtually unchanged from 12/14/2021, mild hypokalemia likely secondary to GI losses.  Repeat hemoglobin has been  ordered.  Case is signed out to Dr. Tamera Punt.  Final Clinical Impression(s) / ED Diagnoses Final diagnoses:  Hematemesis with nausea  PONV (postoperative nausea and vomiting)  Acute nonintractable headache, unspecified headache type  Hypokalemia    Rx / DC Orders ED Discharge Orders          Ordered    famotidine (PEPCID) 20 MG tablet  2 times daily        01/21/22 3567              Delora Fuel, MD 01/41/03 [phone]

## 2023-12-21 ENCOUNTER — Encounter (HOSPITAL_COMMUNITY): Payer: Self-pay | Admitting: Emergency Medicine

## 2023-12-21 ENCOUNTER — Emergency Department (HOSPITAL_COMMUNITY)

## 2023-12-21 ENCOUNTER — Emergency Department (HOSPITAL_COMMUNITY)
Admission: EM | Admit: 2023-12-21 | Discharge: 2023-12-21 | Disposition: A | Discharge status: Home / Self Care | Attending: Emergency Medicine | Admitting: Emergency Medicine

## 2023-12-21 ENCOUNTER — Other Ambulatory Visit: Payer: Self-pay

## 2023-12-21 DIAGNOSIS — M79601 Pain in right arm: Secondary | ICD-10-CM | POA: Diagnosis present

## 2023-12-21 DIAGNOSIS — Z7951 Long term (current) use of inhaled steroids: Secondary | ICD-10-CM | POA: Insufficient documentation

## 2023-12-21 DIAGNOSIS — Z91010 Allergy to peanuts: Secondary | ICD-10-CM | POA: Diagnosis not present

## 2023-12-21 DIAGNOSIS — M79602 Pain in left arm: Secondary | ICD-10-CM | POA: Insufficient documentation

## 2023-12-21 DIAGNOSIS — Z91040 Latex allergy status: Secondary | ICD-10-CM | POA: Diagnosis not present

## 2023-12-21 DIAGNOSIS — J449 Chronic obstructive pulmonary disease, unspecified: Secondary | ICD-10-CM | POA: Diagnosis not present

## 2023-12-21 DIAGNOSIS — M25511 Pain in right shoulder: Secondary | ICD-10-CM | POA: Insufficient documentation

## 2023-12-21 DIAGNOSIS — W19XXXA Unspecified fall, initial encounter: Secondary | ICD-10-CM

## 2023-12-21 DIAGNOSIS — W108XXA Fall (on) (from) other stairs and steps, initial encounter: Secondary | ICD-10-CM | POA: Insufficient documentation

## 2023-12-21 DIAGNOSIS — M542 Cervicalgia: Secondary | ICD-10-CM | POA: Diagnosis not present

## 2023-12-21 MED ORDER — CYCLOBENZAPRINE HCL 10 MG PO TABS: 10.0000 mg | ORAL_TABLET | Freq: Two times a day (BID) | ORAL | 0 refills | Status: AC | PRN

## 2023-12-21 MED ORDER — CYCLOBENZAPRINE HCL 10 MG PO TABS: 10.0000 mg | ORAL_TABLET | Freq: Two times a day (BID) | ORAL | 0 refills | Status: DC | PRN

## 2023-12-21 NOTE — Discharge Instructions (Signed)
 You were evaluated in the emergency room following a fall.  Your imaging did not show any significant abnormality.  Please follow-up with your primary care doctor for further evaluation.  Prescription for Flexeril was sent into your pharmacy.  Please avoid driving or drinking alcohol while using this medication as it may cause drowsiness.

## 2023-12-21 NOTE — ED Triage Notes (Signed)
 [place].  Reports the hand rail was loose and did not keep her from falling. Pain to the back of her head on the right, down to right neck, right flank/ribs, and buttocks as well as right hand/wrist.

## 2023-12-21 NOTE — ED Notes (Addendum)
 No answer for room assignment

## 2023-12-21 NOTE — ED Notes (Signed)
 Called to triage. Did not respond from waiting room.

## 2023-12-21 NOTE — ED Provider Triage Note (Signed)
 Emergency Medicine Provider Triage Evaluation Note  Meryem Haertel , a 48 y.o. female  was evaluated in triage.  Pt complains of right upper extremity, right hip, left foot, and head pain status post fall on Monday.  Patient reports that her shoe broke causing her to fall backwards landing on her bottom and hitting her back.  Denies any loss of consciousness.  She reports that she cannot take Tylenol or ibuprofen  because of her liver and because of an allergy.  Review of Systems  Positive:  Negative:   Physical Exam  BP (!) 128/94   Pulse 76   Temp 98 F (36.7 C) (Oral)   Resp 19   SpO2 100%  Gen:   Awake, no distress   Resp:  Normal effort  MSK:   Moves extremities without difficulty  Other:  Patient ambulated into the emergency department.  She is having some diffuse tenderness to the lower aspect of their right arm.  Palpable radial pulses.  Does have some diffuse tenderness to the back and the C-spine and head.  Patient has some tenderness diffusely to the left ankle and foot but not into the right lower extremity.  Does have some tenderness to the right hip as well.  Abdomen soft. Medical Decision Making  Medically screening exam initiated at 7:36 PM.  Appropriate orders placed.  Solace Manwarren Eberlein was informed that the remainder of the evaluation will be completed by another provider, this initial triage assessment does not replace that evaluation, and the importance of remaining in the ED until their evaluation is complete.  X-rays and CT ordered   Bernis Ernst, [place] 12/21/23 [id number]

## 2023-12-21 NOTE — ED Provider Notes (Signed)
 [place] EMERGENCY DEPARTMENT AT [facility] Provider Note   CSN: [id number] Arrival date & time: 12/21/23  1810     Patient presents with: [name] is a 48 y.o. female who presents following mechanical fall down a flight of ten steps 2 days ago. She denies LOC. She has been ambulatory since the fall. No chest pain, SOB or abd pain reported. Primarily complains of pain to her right flank, right posterior shoulder and R side of neck. Denies any numbness or tingling.     Fall   Past Medical History:  Diagnosis Date   Asthma    Celiac disease    COPD (chronic obstructive pulmonary disease) (HCC)    Pelvic pain    Pneumonia    Past Surgical History:  Procedure Laterality Date   ABDOMINAL HYSTERECTOMY     ABDOMINAL SURGERY     COLONOSCOPY  08/25/12   FRACTURE SURGERY         Prior to Admission medications   Medication Sig Start Date End Date Taking? Authorizing Provider  cyclobenzaprine (FLEXERIL) 10 MG tablet Take 1 tablet (10 mg total) by mouth 2 (two) times daily as needed for muscle spasms. 12/21/23  Yes Donnajean Lynwood DEL, PA-C  acetic acid  2 % otic solution Place 4 drops into the left ear every 3 (three) hours. 12/27/20   Dasie Faden, MD  albuterol (PROVENTIL HFA;VENTOLIN HFA) 108 (90 BASE) MCG/ACT inhaler Inhale 2 puffs into the lungs every 6 (six) hours as needed.      [provider]  amoxicillin -clavulanate (AUGMENTIN ) 875-125 MG tablet Take 1 tablet by mouth every 12 (twelve) hours. 12/19/20   Graham, Laura E, PA-C  famotidine  (PEPCID ) 20 MG tablet Take 1 tablet (20 mg total) by mouth 2 (two) times daily. 01/21/22   Raford Lenis, MD  fluticasone furoate-vilanterol (BREO ELLIPTA) 100-25 MCG/INH AEPB Inhale 1 puff into the lungs daily.    [provider]  ibuprofen  (ADVIL ,MOTRIN ) 600 MG tablet Take 1 tablet (600 mg total) by mouth every 6 (six) hours as needed. 10/07/17   Wieters, Hallie C, PA-C  ondansetron  (ZOFRAN -ODT)  4 MG disintegrating tablet 4mg  ODT q4 hours prn nausea/vomit 01/21/22   Lenor Hollering, MD  OVER THE COUNTER MEDICATION 1 tablet as needed. Laxative pill takes as needed    [provider]  triamcinolone  cream (KENALOG ) 0.1 % Apply 1 application topically 2 (two) times daily. 10/07/17   Wieters, Hallie C, PA-C    Allergies: Amitriptyline, Aspirin, Ceftriaxone , Ciprofloxacin, Egg protein-containing drug products, Fish oil, Fish protein-containing drug products, Flavoring agent (non-screening), Gabapentin, Hydroxyzine, Ibuprofen , Influenza vaccines, Ivp dye [iodinated contrast media], Levofloxacin, Morphine and codeine, Naproxen, Naproxen sodium, Peanut oil, Pentosan polysulfate, Pentosan polysulfate sodium, Porcine (pork) protein-containing drug products, Prednisone, Shellfish allergy, Shellfish protein-containing drug products, Tape, Diphenhydramine hcl, Doxycycline, Hydrocodone-acetaminophen, Hydromorphone  hcl, Metronidazole, Oxycodone-acetaminophen, Pantoprazole sodium, Promethazine hcl, Propoxyphene n-acetaminophen, Latex, and Lyrica [pregabalin]    Review of Systems  Musculoskeletal:  Positive for myalgias.    Updated Vital Signs BP (!) 128/94   Pulse 76   Temp 98 F (36.7 C) (Oral)   Resp 19   SpO2 100%   Physical Exam Vitals and nursing note reviewed.  Constitutional:      General: She is not in acute distress.    Appearance: She is well-developed.  HENT:     Head: Normocephalic and atraumatic.  Eyes:     Conjunctiva/sclera: Conjunctivae normal.  Cardiovascular:     Rate and Rhythm: Normal  rate and regular rhythm.     Heart sounds: No murmur heard. Pulmonary:     Effort: Pulmonary effort is normal. No respiratory distress.     Breath sounds: Normal breath sounds.  Abdominal:     Palpations: Abdomen is soft.     Tenderness: There is no abdominal tenderness.  Musculoskeletal:        General: No swelling.     Cervical back: Neck supple.     Comments:  is globally  tender along bilateral arms, right thoracic and lumbar paraspinal region without midline tenderness, right cervical paraspinal region without midline tenderness, right flank, no gross deformities, no significant ecchymosis  Skin:    General: Skin is warm and dry.     Capillary Refill: Capillary refill takes less than 2 seconds.  Neurological:     Mental Status: She is alert.  Psychiatric:        Mood and Affect: Mood normal.     (all labs ordered are listed, but only abnormal results are displayed) Labs Reviewed - No data to display  EKG: None  Radiology: CT Cervical Spine Wo Contrast Result Date: 12/21/2023 EXAM: CT CERVICAL SPINE WITHOUT CONTRAST 12/21/2023 08:44:00 PM TECHNIQUE: CT of the cervical spine was performed without the administration of intravenous contrast. Multiplanar reformatted images are provided for review. Automated exposure control, iterative reconstruction, and/or weight based adjustment of the mA/kV was utilized to reduce the radiation dose to as low as reasonably achievable. COMPARISON: MRI 06/23/2018 20: 19 CLINICAL HISTORY: Neck trauma, dangerous injury mechanism (Age [age]y). Table formatting from the original note was not included.; Notes from triage:; [place]. Reports the hand rail was loose and did not keep her from falling. Pain to the back of her head on the right, down to right neck, right flank/ribs, and buttocks as well as right hand/wrist. FINDINGS: CERVICAL SPINE: BONES AND ALIGNMENT: Loss of lordosis is likely chronic or positional. No acute fracture or traumatic malalignment. DEGENERATIVE CHANGES: Mild disc space height loss at C6-C7. No severe spinal canal narrowing. SOFT TISSUES: No prevertebral soft tissue swelling. IMPRESSION: 1. No acute abnormality of the cervical spine. Electronically signed by: Norman Gatlin MD 12/21/2023 08:50 PM EDT RP Workstation: [id number]   CT Head Wo  Contrast Result Date: 12/21/2023 EXAM: CT HEAD WITHOUT CONTRAST 12/21/2023 08:44:00 PM TECHNIQUE: CT of the head was performed without the administration of intravenous contrast. Automated exposure control, iterative reconstruction, and/or weight based adjustment of the mA/kV was utilized to reduce the radiation dose to as low as reasonably achievable. COMPARISON: Comparison with 09/02/2022. CLINICAL HISTORY: Head trauma, moderate-severe. Pt reports [place]. Reports the hand rail was loose and did not keep her from falling. Pain to the back of her head on the right, down to right neck, right flank/ribs, and buttocks as well as right hand/wrist. FINDINGS: BRAIN AND VENTRICLES: No acute hemorrhage. No evidence of acute infarct. No hydrocephalus. No extra-axial collection. No mass effect or midline shift. ORBITS: No acute abnormality. SINUSES: No acute abnormality. SOFT TISSUES AND SKULL: No acute soft tissue abnormality. No skull fracture. IMPRESSION: 1. No acute intracranial abnormality. Electronically signed by: Norman Gatlin MD 12/21/2023 08:48 PM EDT RP Workstation: [id number]   DG Lumbar Spine Complete Result Date: 12/21/2023 EXAM: 4 VIEW(S) XRAY OF THE LUMBAR SPINE 12/21/2023 08:15:00 PM COMPARISON: None available. CLINICAL HISTORY: Pain sp fall. Pt c/o entire right side  pain after [place] when her right heel broke on her boot- also c/o posterior head and neck pain radiating through her right shoulder and making her right arm numb. No previous injuries or surgeries. FINDINGS: BONES: No acute fracture. No aggressive appearing osseous lesion. Alignment is normal. DISCS AND DEGENERATIVE CHANGES: No severe degenerative changes. SOFT TISSUES: No acute abnormality. IMPRESSION: 1. No acute abnormality of the lumbar spine. Electronically signed by: Norman Gatlin MD 12/21/2023 08:32 PM EDT RP Workstation: [id number]   DG  Pelvis 1-2 Views Result Date: 12/21/2023 EXAM: 1 or 2 VIEW(S) XRAY OF THE PELVIS 12/21/2023 08:15:00 PM COMPARISON: None available. CLINICAL HISTORY: FINDINGS: BONES AND JOINTS: No acute fracture. No focal osseous lesion. No joint dislocation. SOFT TISSUES: The soft tissues are unremarkable. IMPRESSION: 1. No acute findings. Electronically signed by: Norman Gatlin MD 12/21/2023 08:31 PM EDT RP Workstation: [id number]   DG Chest 2 View Result Date: 12/21/2023 EXAM: 2 VIEW(S) XRAY OF THE CHEST 12/21/2023 08:15:00 PM COMPARISON: 12 / 22 / 218. CLINICAL HISTORY: pain sp fall. Pt c/o entire right side pain after [place] when her right heel broke on her boot- also c/o posterior head and neck pain radiating through her right shoulder and making her right arm numb. No previous injuries or ; surgeries FINDINGS: LUNGS AND PLEURA: No focal pulmonary opacity. No pulmonary edema. No pleural effusion. No pneumothorax. HEART AND MEDIASTINUM: No acute abnormality of the cardiac and mediastinal silhouettes. BONES AND SOFT TISSUES: No acute osseous abnormality. IMPRESSION: 1. No acute cardiopulmonary process. Electronically signed by: Norman Gatlin MD 12/21/2023 08:31 PM EDT RP Workstation: [id number]   DG Hand Complete Right Result Date: 12/21/2023 EXAM: 3 OR MORE VIEW(S) XRAY OF THE RIGHT HAND 12/21/2023 08:15:00 PM COMPARISON: None available. CLINICAL HISTORY: pain sp fall. Pt c/o entire right side pain after [place] when her right heel broke on her boot- also c/o posterior head and neck pain radiating through her right shoulder and making her right arm numb. No previous injuries or ; surgeries FINDINGS: BONES AND JOINTS: No acute fracture. No focal osseous lesion. No joint dislocation. SOFT TISSUES: The soft tissues are unremarkable. IMPRESSION: 1. No acute osseous abnormality. Electronically signed by: Norman Gatlin MD 12/21/2023 08:30 PM EDT RP  Workstation: [id number]   DG Wrist Complete Right Result Date: 12/21/2023 EXAM: 3 OR MORE VIEW(S) XRAY OF THE RIGHT WRIST 12/21/2023 08:15:00 PM COMPARISON: None available. CLINICAL HISTORY: pain sp fall. Pt c/o entire right side pain after [place] when her right heel broke on her boot- also c/o posterior head and neck pain radiating through her right shoulder and making her right arm numb. No previous injuries or ; surgeries FINDINGS: BONES AND JOINTS: No acute fracture. No focal osseous lesion. No joint dislocation. SOFT TISSUES: The soft tissues are unremarkable. IMPRESSION: 1. No acute fracture or dislocation. Electronically signed by: Norman Gatlin MD 12/21/2023 08:29 PM EDT RP Workstation: [id number]   DG Forearm Right Result Date: 12/21/2023 EXAM: VIEW(S) XRAY OF THE RIGHT FOREARM 12/21/2023 08:15:00 PM COMPARISON: None available. CLINICAL HISTORY: Pain after fall. Patient complains of entire right side pain after [place] when her right heel broke on her boot. Also complains of posterior head and neck pain radiating through her right shoulder and making her right arm numb. No previous injuries or surgeries. FINDINGS: FINDINGS: BONES AND JOINTS: No acute fracture.  No focal osseous lesion. No joint dislocation. SOFT TISSUES: The soft tissues are unremarkable. IMPRESSION: 1. No acute traumatic injury. Electronically signed by: Norman Gatlin MD 12/21/2023 08:29 PM EDT RP Workstation: [id number]   DG Shoulder Right Result Date: 12/21/2023 EXAM: 1 VIEW XRAY OF THE RIGHT SHOULDER 12/21/2023 08:15:00 PM COMPARISON: None available. CLINICAL HISTORY: Pain after fall. Patient complains of entire right side pain after [place] when her right heel broke on her boot. Also complains of posterior head and neck pain radiating through her right shoulder and making her right arm numb. No previous injuries or  surgeries. FINDINGS: BONES AND JOINTS: Glenohumeral joint is normally aligned. No acute fracture or dislocation. The [facility] joint is unremarkable in appearance. SOFT TISSUES: No abnormal calcifications. Visualized lung is unremarkable. IMPRESSION: 1. No acute findings. Electronically signed by: Norman Gatlin MD 12/21/2023 08:29 PM EDT RP Workstation: [id number]   DG Foot Complete Left Result Date: 12/21/2023 EXAM: 3 OR MORE VIEW(S) XRAY OF THE LEFT FOOT 12/21/2023 08:15:00 PM COMPARISON: None available. CLINICAL HISTORY: FINDINGS: BONES AND JOINTS: No acute fracture. No focal osseous lesion. No joint dislocation. SOFT TISSUES: The soft tissues are unremarkable. IMPRESSION: 1. No acute osseous abnormality. Electronically signed by: Norman Gatlin MD 12/21/2023 08:28 PM EDT RP Workstation: [id number]   DG Ankle Complete Left Result Date: 12/21/2023 EXAM: 3 VIEW(S) XRAY OF THE LEFT ANKLE 10 / 22 / 25 at 8: 00 pm CLINICAL HISTORY: COMPARISON: None available. FINDINGS: BONES AND JOINTS: No acute fracture. No focal osseous lesion. No joint dislocation. SOFT TISSUES: The soft tissues are unremarkable. IMPRESSION: 1. No acute osseous abnormality. Electronically signed by: Norman Gatlin MD 12/21/2023 08:28 PM EDT RP Workstation: [id number]     Procedures   Medications Ordered in the ED - No data to display  Clinical Course as of 12/21/23 2319  Wed Dec 21, 2023  2314 Patient evaluated after falling down the steps 2 days ago.  Complaining of  pain everywhere.  Pain is worse along her posterior shoulder, right flank.  She is hemodynamically stable.  All the imaging ordered in triage is unremarkable.  She does describe pain along her right flank that is worse with respirations.  Will provide incentive spirometer she for possible occult rib fracture.  Encouraged to follow-up with PCP.  She has 36 drug allergies.  She states that she can take Flexeril we will send this into her pharmacy. [JT]    Clinical Course  User Index [JT] Donnajean Lynwood DEL, PA-C                                 Medical Decision Making  This patient presents to the ED with chief complaint(s) of Fall .  The complaint involves an extensive differential diagnosis and also carries with it a high risk of complications and morbidity.   Pertinent past medical history as listed in HPI  The differential diagnosis includes  No evidence of fracture, dislocation and sprain.  No evidence of intracranial hemorrhage.  No symptoms to suggest intra-abdominal pathology. Additional history obtained: Records reviewed Care Everywhere/External Records  Disposition:   Patient will be discharged home. The patient has been appropriately medically screened and/or stabilized in the ED. I have low suspicion for any other emergent medical condition which would require further screening, evaluation or treatment in the ED or require inpatient management. At time of discharge the patient is hemodynamically stable and in  no acute distress. I have discussed work-up results and diagnosis with patient and answered all questions. Patient is agreeable with discharge plan. We discussed strict return precautions for returning to the emergency department and they verbalized understanding.     Social Determinants of Health:   none  This note was dictated with voice recognition software.  Despite best efforts at proofreading, errors may have occurred which can change the documentation meaning.       Final diagnoses:  Fall, initial encounter    ED Discharge Orders          Ordered    cyclobenzaprine (FLEXERIL) 10 MG tablet  2 times daily PRN        12/21/23 2308               Donnajean Lynwood DEL, PA-C 12/21/23 2319    Charlyn Sora, MD 12/22/23 [phone]
